# Patient Record
Sex: Female | Born: 1985
Health system: Southern US, Community
[De-identification: ages and names within clinical notes are randomized; demographics above are authoritative.]

## PROBLEM LIST (undated history)

## (undated) DIAGNOSIS — E282 Polycystic ovarian syndrome: Secondary | ICD-10-CM

## (undated) DIAGNOSIS — O99119 Other diseases of the blood and blood-forming organs and certain disorders involving the immune mechanism complicating pregnancy, unspecified trimester: Secondary | ICD-10-CM

## (undated) DIAGNOSIS — D696 Thrombocytopenia, unspecified: Secondary | ICD-10-CM

## (undated) DIAGNOSIS — O0990 Supervision of high risk pregnancy, unspecified, unspecified trimester: Secondary | ICD-10-CM

## (undated) HISTORY — DX: Supervision of high risk pregnancy, unspecified, unspecified trimester: O09.90

## (undated) HISTORY — DX: Polycystic ovarian syndrome: E28.2

---

## 2008-05-07 DIAGNOSIS — D696 Thrombocytopenia, unspecified: Secondary | ICD-10-CM

## 2008-05-07 HISTORY — DX: Thrombocytopenia, unspecified: D69.6

## 2009-04-29 ENCOUNTER — Inpatient Hospital Stay (HOSPITAL_COMMUNITY): Admission: AD | Admit: 2009-04-29 | Discharge: 2009-04-29 | Payer: Self-pay | Admitting: Obstetrics and Gynecology

## 2009-04-30 ENCOUNTER — Inpatient Hospital Stay (HOSPITAL_COMMUNITY): Admission: AD | Admit: 2009-04-30 | Discharge: 2009-05-02 | Payer: Self-pay | Admitting: Pediatrics

## 2010-08-07 LAB — CBC
HCT: 36.7 % (ref 36.0–46.0)
HCT: 37.2 % (ref 36.0–46.0)
Hemoglobin: 11.8 g/dL — ABNORMAL LOW (ref 12.0–15.0)
Hemoglobin: 11.9 g/dL — ABNORMAL LOW (ref 12.0–15.0)
MCHC: 32.3 g/dL (ref 30.0–36.0)
MCHC: 32.9 g/dL (ref 30.0–36.0)
MCHC: 33.2 g/dL (ref 30.0–36.0)
MCHC: 33.4 g/dL (ref 30.0–36.0)
MCV: 82 fL (ref 78.0–100.0)
Platelets: 85 10*3/uL — ABNORMAL LOW (ref 150–400)
Platelets: 94 10*3/uL — ABNORMAL LOW (ref 150–400)
RBC: 4.36 MIL/uL (ref 3.87–5.11)
RBC: 4.53 MIL/uL (ref 3.87–5.11)
RDW: 14.4 % (ref 11.5–15.5)
RDW: 14.4 % (ref 11.5–15.5)
WBC: 10.5 10*3/uL (ref 4.0–10.5)
WBC: 18.8 10*3/uL — ABNORMAL HIGH (ref 4.0–10.5)

## 2010-08-07 LAB — RPR: RPR Ser Ql: NONREACTIVE

## 2011-03-09 ENCOUNTER — Emergency Department (HOSPITAL_BASED_OUTPATIENT_CLINIC_OR_DEPARTMENT_OTHER)
Admission: EM | Admit: 2011-03-09 | Discharge: 2011-03-09 | Disposition: A | Discharge status: Home / Self Care | Payer: Managed Care, Other (non HMO) | Attending: Emergency Medicine | Admitting: Emergency Medicine

## 2011-03-09 ENCOUNTER — Encounter: Payer: Self-pay | Admitting: *Deleted

## 2011-03-09 ENCOUNTER — Emergency Department (INDEPENDENT_AMBULATORY_CARE_PROVIDER_SITE_OTHER): Payer: Managed Care, Other (non HMO)

## 2011-03-09 DIAGNOSIS — M542 Cervicalgia: Secondary | ICD-10-CM | POA: Insufficient documentation

## 2011-03-09 DIAGNOSIS — M436 Torticollis: Secondary | ICD-10-CM | POA: Insufficient documentation

## 2011-03-09 MED ORDER — HYDROCODONE-ACETAMINOPHEN 5-325 MG PO TABS: 2.0000 | ORAL_TABLET | ORAL | Status: AC | PRN

## 2011-03-09 MED ORDER — DIAZEPAM 5 MG PO TABS
5.0000 mg | ORAL_TABLET | Freq: Once | ORAL | Status: AC
  Administered 2011-03-09: 5 mg via ORAL
  Filled 2011-03-09: qty 1

## 2011-03-09 MED ORDER — IBUPROFEN 800 MG PO TABS
800.0000 mg | ORAL_TABLET | Freq: Once | ORAL | Status: AC
  Administered 2011-03-09: 800 mg via ORAL
  Filled 2011-03-09: qty 1

## 2011-03-09 MED ORDER — IBUPROFEN 800 MG PO TABS: 800.0000 mg | ORAL_TABLET | Freq: Three times a day (TID) | ORAL | Status: AC

## 2011-03-09 MED ORDER — CYCLOBENZAPRINE HCL 10 MG PO TABS: 10.0000 mg | ORAL_TABLET | Freq: Two times a day (BID) | ORAL | Status: AC | PRN

## 2011-03-09 MED ORDER — OXYCODONE-ACETAMINOPHEN 5-325 MG PO TABS
1.0000 | ORAL_TABLET | Freq: Once | ORAL | Status: AC
  Administered 2011-03-09: 1 via ORAL
  Filled 2011-03-09: qty 1

## 2011-03-09 NOTE — ED Provider Notes (Signed)
Medical screening examination/treatment/procedure(s) were performed by non-physician practitioner and as supervising physician I was immediately available for consultation/collaboration.   Joya Gaskins, MD 03/09/11 1534

## 2011-03-09 NOTE — ED Provider Notes (Signed)
History     CSN: [id number] Arrival date & time: 03/09/2011 12:05 PM   First MD Initiated Contact with Patient 03/09/11 1211      Chief Complaint  Patient presents with  . Neck Pain    (Consider location/radiation/quality/duration/timing/severity/associated sxs/prior treatment) Patient is a 25 y.o. female presenting with neck pain. The history is provided by the patient. The history is limited by a language barrier. No language interpreter was used.  Neck Pain  This is a new problem. The current episode started more than 2 days ago. The problem occurs constantly. The problem has not changed since onset.The pain is associated with nothing. There has been no fever. The pain is present in the generalized neck. The pain is at a severity of 6/10. The pain is moderate. The pain is worse during the day. Stiffness is present in the morning. She has tried heat for the symptoms. The treatment provided no relief.    History reviewed. No pertinent past medical history.  History reviewed. No pertinent past surgical history.  No family history on file.  History  Substance Use Topics  . Smoking status: Never Smoker   . Smokeless tobacco: Not on file  . Alcohol Use: Yes    OB History    Grav Para Term Preterm Abortions TAB SAB Ect Mult Living                  Review of Systems  HENT: Positive for neck pain.   All other systems reviewed and are negative.    Allergies  Review of patient's allergies indicates no known allergies.  Home Medications  No current outpatient prescriptions on file.  BP 126/74  Pulse 76  Temp(Src) 98.2 F (36.8 C) (Oral)  Resp 20  SpO2 100%  Physical Exam  Nursing note and vitals reviewed. Constitutional: She appears well-developed and well-nourished.  HENT:  Head: Normocephalic and atraumatic.  Mouth/Throat: Oropharynx is clear and moist.  Eyes: Pupils are equal, round, and reactive to light.  Neck: Neck supple.       Decreased range of motion,   nv and ns intact  Cardiovascular: Normal rate.   Pulmonary/Chest: Effort normal.  Musculoskeletal: Normal range of motion.  Skin: Skin is warm.  Psychiatric: She has a normal mood and affect.    ED Course  Procedures (including critical care time)  Labs Reviewed - No data to display Dg Cervical Spine Complete  03/09/2011  *RADIOLOGY REPORT*  Clinical Data: Neck pain radiating into the right shoulder.  No injury.  CERVICAL SPINE - COMPLETE 4+ VIEW  Comparison: None.  Findings: No prevertebral soft tissue swelling is identified.  No cervical vertebral malalignment noted.  No cervical spine fracture is evident.  IMPRESSION:  No significant abnormality identified.  If symptoms persist despite conservative therapy, MRI followup may be warranted.  Original Report Authenticated By: Dellia Cloud, M.D.     No diagnosis found.    MDM  c spine no fx.   Pt given percocet, valium and ibuprofen.   Pt has increased motion,  Pt still has pain.       Langston Masker, [place] 03/09/11 1334  Langston Masker, [place] 03/09/11 1336

## 2011-03-09 NOTE — ED Notes (Signed)
Patient states that she started experiencing neck and R shoulder pain Wednesday, took 800 mg ibuprofen at that time but no relief, continues to have shooting pain througt shoulder and hurts to turn head

## 2012-05-07 NOTE — L&D Delivery Note (Signed)
Delivery Note At 6:26 PM a viable and healthy female was delivered via Vaginal, Spontaneous Delivery (Presentation: ; Occiput Anterior).  APGAR: 8, 9; weight . pending  Placenta status: Intact, Spontaneous Pathology.  Cord: 3 vessels with the following complications: None.  Cord pH: none   Anesthesia: Epidural  Episiotomy: None Lacerations: None Suture Repair: n/a Est. Blood Loss (mL): 350  Mom to postpartum.  Baby to Couplet care / Skin to Skin.  Hisham Provence A 03/30/2013, 7:06 PM

## 2012-07-24 IMAGING — CR DG CERVICAL SPINE COMPLETE 4+V
6 series · 6 of 6 positions shown · non-contrast
Comparison: None.

CLINICAL DATA: Neck pain radiating into the right shoulder.  No
injury.

CERVICAL SPINE - COMPLETE 4+ VIEW

[w c-spine oblique (1 of 2)]
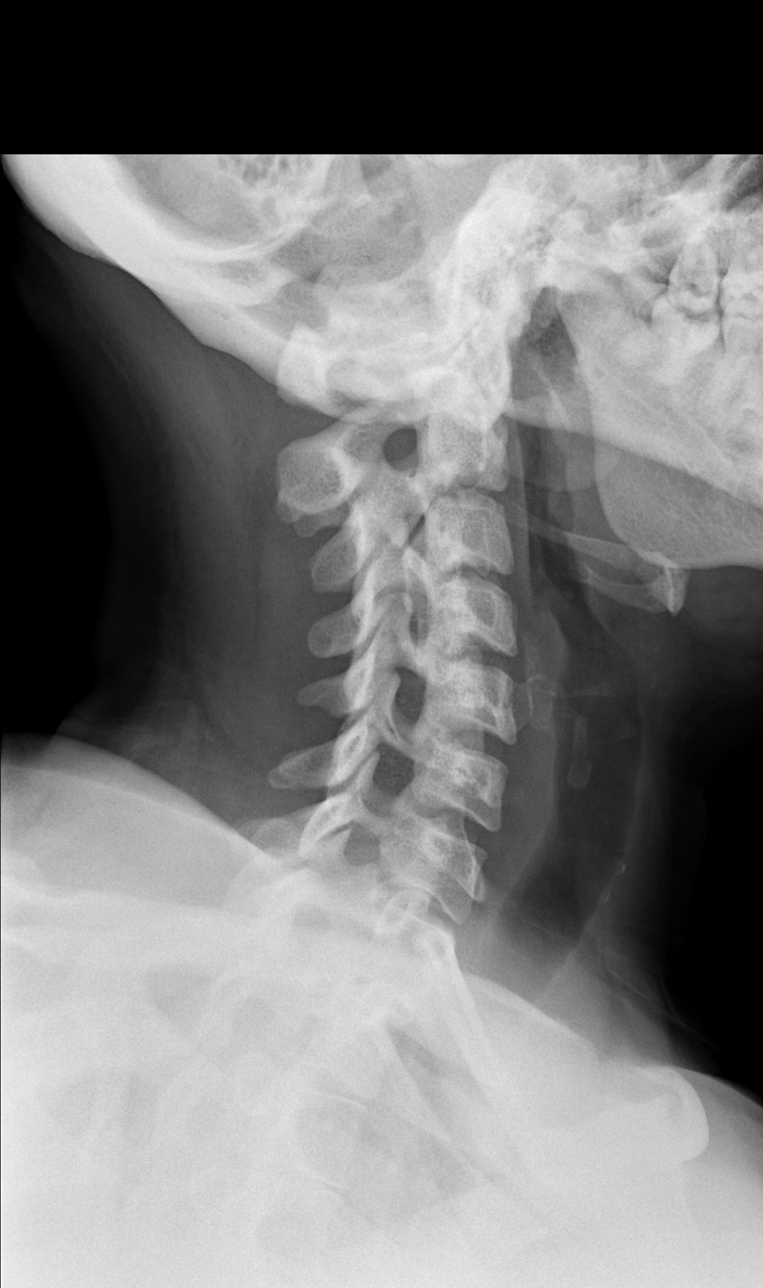

[w c-spine lat]
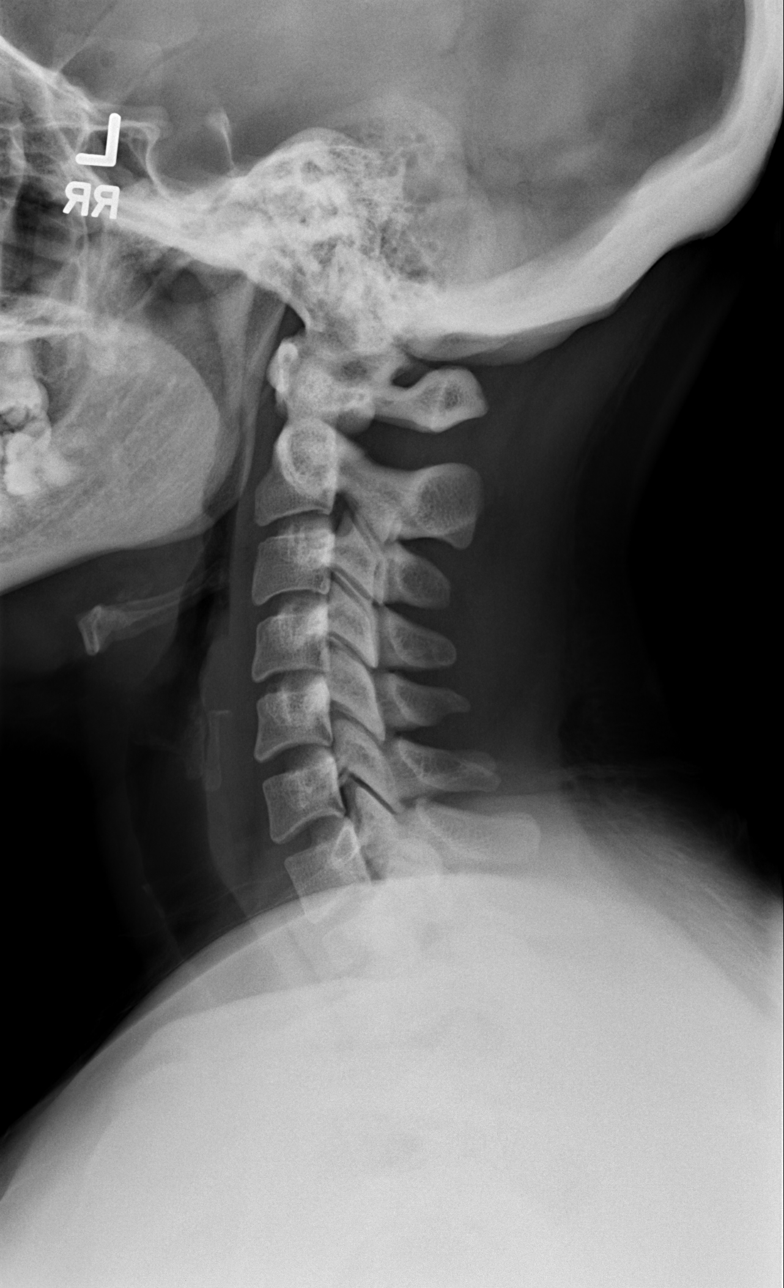

[w c-spine oblique (2 of 2)]
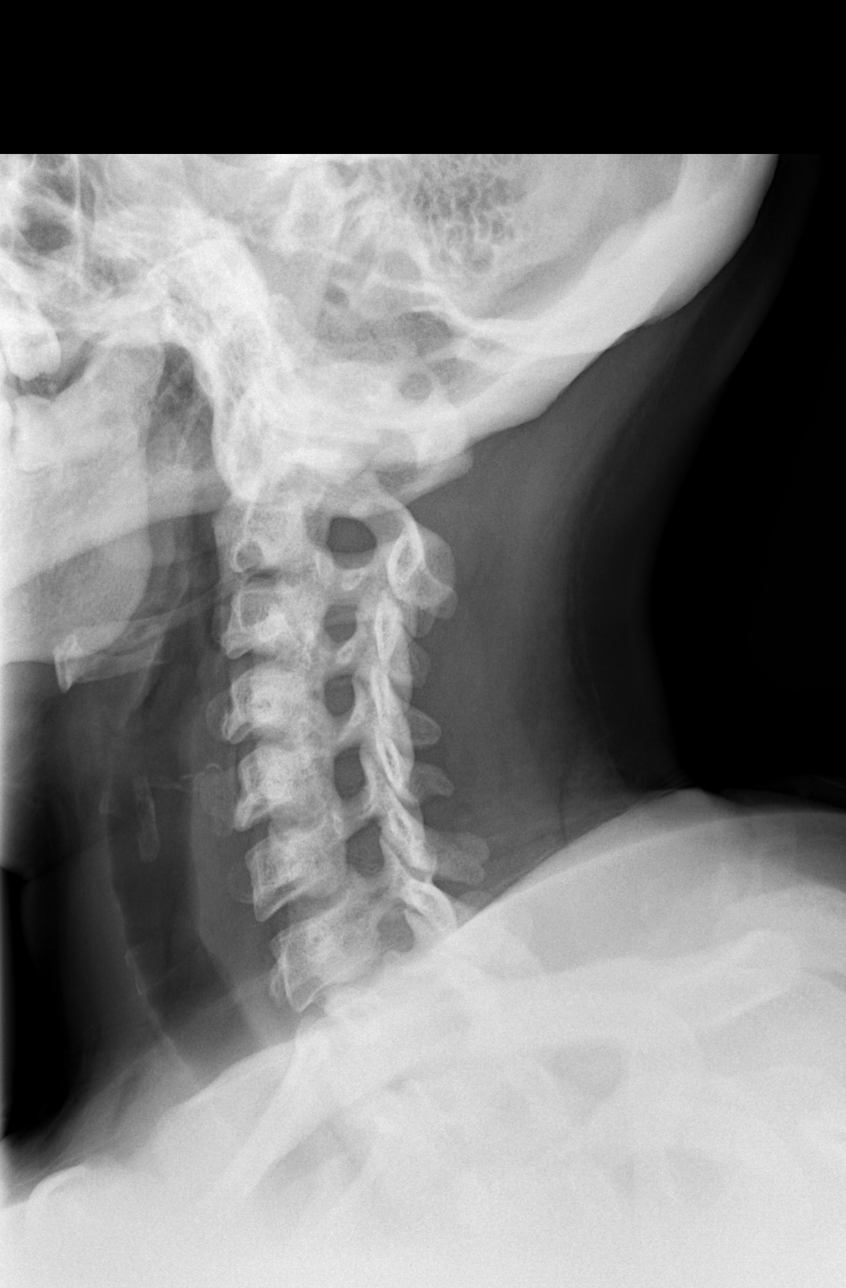

[w c-spine a.p.]
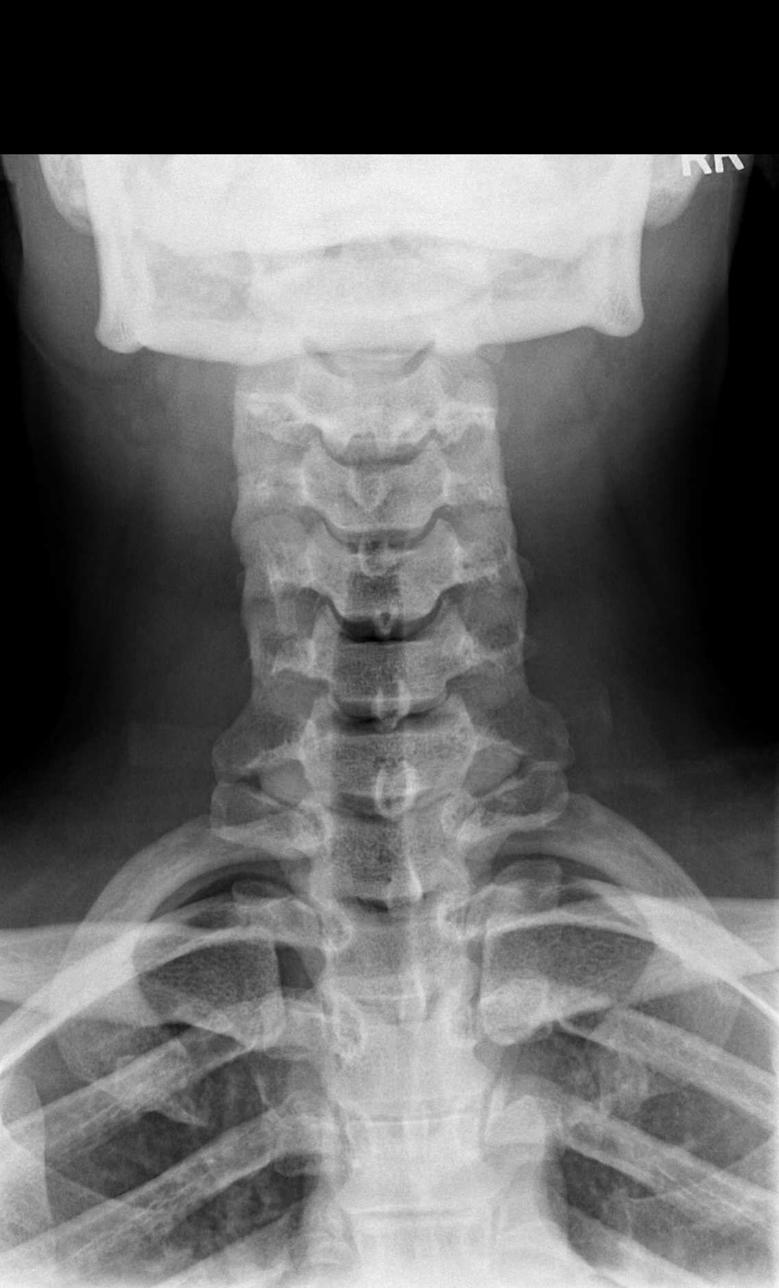

[w c-spine odontoid (1 of 2)]
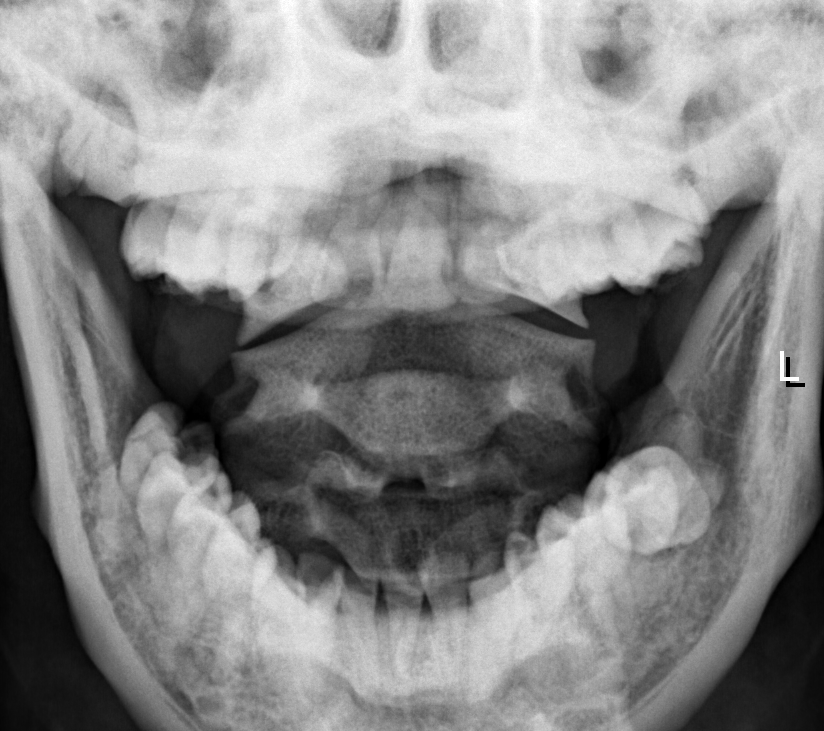

[w c-spine odontoid (2 of 2)]
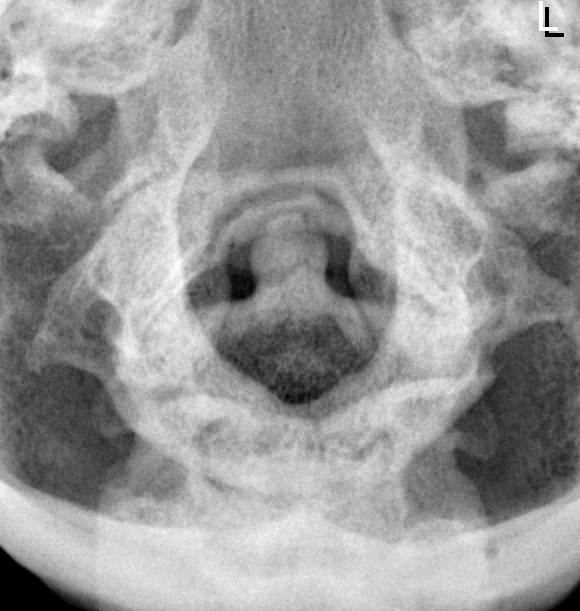

[6 of 6 positions shown; findings below may reference images not displayed]

FINDINGS: No prevertebral soft tissue swelling is identified.  No
cervical vertebral malalignment noted.  No cervical spine fracture
is evident.
IMPRESSION: No significant abnormality identified.  If symptoms persist
despite conservative therapy, MRI followup may be warranted.

## 2012-09-12 LAB — OB RESULTS CONSOLE ANTIBODY SCREEN: Antibody Screen: NEGATIVE

## 2012-09-12 LAB — OB RESULTS CONSOLE HEPATITIS B SURFACE ANTIGEN: Hepatitis B Surface Ag: NEGATIVE

## 2012-09-12 LAB — OB RESULTS CONSOLE ABO/RH

## 2012-09-12 LAB — OB RESULTS CONSOLE RUBELLA ANTIBODY, IGM: Rubella: IMMUNE

## 2012-09-20 LAB — OB RESULTS CONSOLE GC/CHLAMYDIA
Chlamydia: NEGATIVE
Gonorrhea: NEGATIVE

## 2013-01-14 ENCOUNTER — Encounter: Payer: Self-pay | Admitting: Oncology

## 2013-01-14 ENCOUNTER — Other Ambulatory Visit: Payer: Self-pay | Admitting: Oncology

## 2013-01-14 DIAGNOSIS — O0990 Supervision of high risk pregnancy, unspecified, unspecified trimester: Secondary | ICD-10-CM

## 2013-01-14 DIAGNOSIS — D696 Thrombocytopenia, unspecified: Secondary | ICD-10-CM | POA: Insufficient documentation

## 2013-01-14 HISTORY — DX: Supervision of high risk pregnancy, unspecified, unspecified trimester: O09.90

## 2013-01-20 ENCOUNTER — Telehealth: Payer: Self-pay | Admitting: Oncology

## 2013-01-20 NOTE — Telephone Encounter (Signed)
C/D 01/20/13 for appt. 01/26/13

## 2013-01-21 ENCOUNTER — Other Ambulatory Visit: Payer: Self-pay | Admitting: Oncology

## 2013-01-26 ENCOUNTER — Encounter: Payer: Self-pay | Admitting: Oncology

## 2013-01-26 ENCOUNTER — Ambulatory Visit (HOSPITAL_BASED_OUTPATIENT_CLINIC_OR_DEPARTMENT_OTHER): Payer: Managed Care, Other (non HMO) | Admitting: Oncology

## 2013-01-26 ENCOUNTER — Ambulatory Visit (HOSPITAL_BASED_OUTPATIENT_CLINIC_OR_DEPARTMENT_OTHER): Payer: Managed Care, Other (non HMO) | Admitting: Lab

## 2013-01-26 ENCOUNTER — Ambulatory Visit: Payer: Managed Care, Other (non HMO)

## 2013-01-26 ENCOUNTER — Telehealth: Payer: Self-pay | Admitting: Oncology

## 2013-01-26 ENCOUNTER — Other Ambulatory Visit: Payer: Self-pay | Admitting: *Deleted

## 2013-01-26 VITALS — BP 115/67 | HR 85 | Temp 98.3°F | Resp 18 | Ht 63.0 in | Wt 229.6 lb

## 2013-01-26 DIAGNOSIS — O0990 Supervision of high risk pregnancy, unspecified, unspecified trimester: Secondary | ICD-10-CM

## 2013-01-26 DIAGNOSIS — D696 Thrombocytopenia, unspecified: Secondary | ICD-10-CM

## 2013-01-26 DIAGNOSIS — O0993 Supervision of high risk pregnancy, unspecified, third trimester: Secondary | ICD-10-CM

## 2013-01-26 LAB — CBC & DIFF AND RETIC
EOS%: 0.6 % (ref 0.0–7.0)
Eosinophils Absolute: 0.1 10*3/uL (ref 0.0–0.5)
Immature Retic Fract: 12.5 % — ABNORMAL HIGH (ref 1.60–10.00)
MCV: 76.6 fL — ABNORMAL LOW (ref 79.5–101.0)
MONO%: 9.7 % (ref 0.0–14.0)
NEUT#: 5.9 10*3/uL (ref 1.5–6.5)
RBC: 4.45 10*6/uL (ref 3.70–5.45)
RDW: 13.9 % (ref 11.2–14.5)
Retic %: 1.73 % (ref 0.70–2.10)
Retic Ct Abs: 76.99 10*3/uL (ref 33.70–90.70)
lymph#: 1.2 10*3/uL (ref 0.9–3.3)

## 2013-01-26 LAB — COMPREHENSIVE METABOLIC PANEL (CC13)
Albumin: 2.9 g/dL — ABNORMAL LOW (ref 3.5–5.0)
Alkaline Phosphatase: 48 U/L (ref 40–150)
BUN: 5 mg/dL — ABNORMAL LOW (ref 7.0–26.0)
CO2: 22 mEq/L (ref 22–29)
Calcium: 8.9 mg/dL (ref 8.4–10.4)
Chloride: 109 mEq/L (ref 98–109)
Glucose: 82 mg/dl (ref 70–140)
Potassium: 4.2 mEq/L (ref 3.5–5.1)
Sodium: 138 mEq/L (ref 136–145)
Total Protein: 6.8 g/dL (ref 6.4–8.3)

## 2013-01-26 LAB — MORPHOLOGY

## 2013-01-26 LAB — CHCC SMEAR

## 2013-01-26 LAB — LACTATE DEHYDROGENASE (CC13): LDH: 149 U/L (ref 125–245)

## 2013-01-26 NOTE — Progress Notes (Signed)
New Patient Hematology-Oncology Evaluation   Melissa Li [id number] Jun 24, 1985 27 y.o. 01/26/2013  CC: Dr. Maxie Better   Reason for referral: Decreased platelet count in a woman in her third trimester of pregnancy    HPI:  27 year old woman in excellent health with no medical or surgical illness. She was first pregnant back in 2010. At time of delivery platelet count fell to a low of 85,000 transiently. A repeat count the same day was 95,000 and she had an uncomplicated epidural anesthetic. She had a vaginal delivery.  We have no followup blood counts in the system until recent prenatal visit to her gynecologist on 01/13/2013. CBC was done. Hemoglobin 12.1, white count 8100, platelet count 97,000. She is now pregnant again and at 30 weeks with a due date of 04/04/2013. She remains asymptomatic. No clinical bleeding. She is on no medication except for her prenatal vitamins.  She has sickle cell trait. No family history of any blood disorders.    PMH: No hypertension, diabetes, asthma, kidney stones, no history of seizure or blood clots. Past Medical History  Diagnosis Date  . Pregnancy, supervision of, high-risk 01/14/2013    No major surgery  Allergies: No Known Allergies  Medications: Prenatal vitamins. As needed Tylenol.  Social History:  She works at a [employer]. She is a healthy 32-year-old daughter. She is an only child.   Family History: I took care of her father who died of complications of metastatic colon cancer 3 years ago. Mother is alive and well. and accompanies her today   Review of Systems: Constitutional symptoms: No constitutional symptoms HEENT: No headache or change in vision Respiratory: No cough or dyspnea Cardiovascular:  No chest pain or palpitations Gastrointestinal ROS: No change in bowel habit Genito-Urinary ROS: No vaginal bleeding or spotting Hematological and Lymphatic: Musculoskeletal: No muscle bone or joint  pain Neurologic: No headache or change in vision Dermatologic: No rash Remaining ROS negative.  Physical Exam: Blood pressure 115/67, pulse 85, temperature 98.3 F (36.8 C), resp. rate 18, height 5\' 3"  (1.6 m), weight 229 lb 9.6 oz (104.146 kg). Wt Readings from Last 3 Encounters:  01/26/13 229 lb 9.6 oz (104.146 kg)    General appearance: Well-nourished African American woman HENNT: Pharynx no erythema exudate ulcer or mass, no thyromegaly Lymph nodes: No cervical, supraclavicular, or axillary adenopathy Breasts: Lungs: Clear to auscultation resonant to percussion Heart: Regular rhythm no murmur gallop or rub Vascular: No cyanosis Abdominal: Third trimester pregnancy GU: Extremities: No edema, no calf tenderness Neurologic: Mental status intact, cranial nerves grossly normal, PERRLA, optic disc sharp and vessels normal, no hemorrhage or exudate, motor strength 5 over 5, reflexes 1+ symmetric Skin: Chronic acne on the face no other rash or ecchymosis    Lab Results: Lab Results  Component Value Date   WBC 18.8* 05/01/2009   HGB 11.8* 05/01/2009   HCT 35.8* 05/01/2009   MCV 82.2 05/01/2009   PLT 95* 05/01/2009     Chemistry   No results found for this basename: NA, K, CL, CO2, BUN, CREATININE, GLU   No results found for this basename: CALCIUM, ALKPHOS, AST, ALT, BILITOT    CBC today 01/26/2013: Hemoglobin 11.5, hematocrit 34.1, MCV 76.6, white count 8000, 75% neutrophils, 15% lymphocytes, platelet count 105,000.   Review of peripheral blood film: Normochromic, normocytic, red cells. Mature white cells. 5-14 platelets per high power field many are large.      Impression and Plan: Likely benign thrombocytopenia of pregnancy  Actual platelet  count likely higher than the machine count as many of the platelets are quite large and will be counted as red cells.  Recommendation: I don't think she needs any medical intervention at this time. I will monitor blood counts  in this office on a weekly basis until time of delivery. As long as her platelet count stays 80,000 or above she is clear for epidural anesthesia.      Levert Feinstein, MD 01/26/2013, 11:11 AM

## 2013-01-26 NOTE — Telephone Encounter (Signed)
, °

## 2013-01-26 NOTE — Progress Notes (Signed)
Checked in new patient with no financial issues. Mail and phone for communication. °

## 2013-02-02 ENCOUNTER — Other Ambulatory Visit (HOSPITAL_BASED_OUTPATIENT_CLINIC_OR_DEPARTMENT_OTHER): Payer: Managed Care, Other (non HMO) | Admitting: Lab

## 2013-02-02 DIAGNOSIS — O0993 Supervision of high risk pregnancy, unspecified, third trimester: Secondary | ICD-10-CM

## 2013-02-02 DIAGNOSIS — D696 Thrombocytopenia, unspecified: Secondary | ICD-10-CM

## 2013-02-02 LAB — CBC WITH DIFFERENTIAL/PLATELET
Basophils Absolute: 0 10*3/uL (ref 0.0–0.1)
Eosinophils Absolute: 0.1 10*3/uL (ref 0.0–0.5)
HCT: 31.7 % — ABNORMAL LOW (ref 34.8–46.6)
HGB: 10.8 g/dL — ABNORMAL LOW (ref 11.6–15.9)
NEUT#: 6.2 10*3/uL (ref 1.5–6.5)
RDW: 13.8 % (ref 11.2–14.5)
lymph#: 1.6 10*3/uL (ref 0.9–3.3)

## 2013-02-09 ENCOUNTER — Other Ambulatory Visit (HOSPITAL_BASED_OUTPATIENT_CLINIC_OR_DEPARTMENT_OTHER): Payer: Managed Care, Other (non HMO) | Admitting: Lab

## 2013-02-09 DIAGNOSIS — O0993 Supervision of high risk pregnancy, unspecified, third trimester: Secondary | ICD-10-CM

## 2013-02-09 DIAGNOSIS — D696 Thrombocytopenia, unspecified: Secondary | ICD-10-CM

## 2013-02-09 DIAGNOSIS — O99891 Other specified diseases and conditions complicating pregnancy: Secondary | ICD-10-CM

## 2013-02-09 LAB — CBC WITH DIFFERENTIAL/PLATELET
Basophils Absolute: 0 10*3/uL (ref 0.0–0.1)
Eosinophils Absolute: 0.1 10*3/uL (ref 0.0–0.5)
HCT: 35.6 % (ref 34.8–46.6)
HGB: 12.2 g/dL (ref 11.6–15.9)
LYMPH%: 14.4 % (ref 14.0–49.7)
MONO#: 0.8 10*3/uL (ref 0.1–0.9)
NEUT#: 7.8 10*3/uL — ABNORMAL HIGH (ref 1.5–6.5)
NEUT%: 76.8 % (ref 38.4–76.8)
Platelets: 101 10*3/uL — ABNORMAL LOW (ref 145–400)
WBC: 10.2 10*3/uL (ref 3.9–10.3)
nRBC: 0 % (ref 0–0)

## 2013-02-16 ENCOUNTER — Other Ambulatory Visit (HOSPITAL_BASED_OUTPATIENT_CLINIC_OR_DEPARTMENT_OTHER): Payer: Managed Care, Other (non HMO) | Admitting: Lab

## 2013-02-16 DIAGNOSIS — O0993 Supervision of high risk pregnancy, unspecified, third trimester: Secondary | ICD-10-CM

## 2013-02-16 DIAGNOSIS — O99891 Other specified diseases and conditions complicating pregnancy: Secondary | ICD-10-CM

## 2013-02-16 DIAGNOSIS — D696 Thrombocytopenia, unspecified: Secondary | ICD-10-CM

## 2013-02-16 LAB — CBC WITH DIFFERENTIAL/PLATELET
Basophils Absolute: 0 10*3/uL (ref 0.0–0.1)
EOS%: 0.9 % (ref 0.0–7.0)
HCT: 34.1 % — ABNORMAL LOW (ref 34.8–46.6)
HGB: 11.6 g/dL (ref 11.6–15.9)
MCH: 26.1 pg (ref 25.1–34.0)
NEUT%: 72.9 % (ref 38.4–76.8)
lymph#: 1.7 10*3/uL (ref 0.9–3.3)

## 2013-02-17 ENCOUNTER — Telehealth: Payer: Self-pay | Admitting: *Deleted

## 2013-02-17 NOTE — Telephone Encounter (Signed)
Per Dr. Cyndie Chime; spoke with pt informing her platelet count stable @99 .  Pt verbalized understanding and confirmed appt for labs on Mondays and Office visit 03/16/13.

## 2013-02-23 ENCOUNTER — Other Ambulatory Visit (HOSPITAL_BASED_OUTPATIENT_CLINIC_OR_DEPARTMENT_OTHER): Payer: Managed Care, Other (non HMO) | Admitting: Lab

## 2013-02-23 DIAGNOSIS — D696 Thrombocytopenia, unspecified: Secondary | ICD-10-CM

## 2013-02-23 DIAGNOSIS — O0993 Supervision of high risk pregnancy, unspecified, third trimester: Secondary | ICD-10-CM

## 2013-02-23 DIAGNOSIS — O99891 Other specified diseases and conditions complicating pregnancy: Secondary | ICD-10-CM

## 2013-02-23 LAB — CBC WITH DIFFERENTIAL/PLATELET
Basophils Absolute: 0 10*3/uL (ref 0.0–0.1)
Eosinophils Absolute: 0.1 10*3/uL (ref 0.0–0.5)
HGB: 11.9 g/dL (ref 11.6–15.9)
LYMPH%: 15.7 % (ref 14.0–49.7)
MCV: 75.9 fL — ABNORMAL LOW (ref 79.5–101.0)
MONO%: 8.8 % (ref 0.0–14.0)
NEUT#: 6.2 10*3/uL (ref 1.5–6.5)
NEUT%: 74.6 % (ref 38.4–76.8)
Platelets: 90 10*3/uL — ABNORMAL LOW (ref 145–400)
RBC: 4.57 10*6/uL (ref 3.70–5.45)

## 2013-02-27 ENCOUNTER — Telehealth: Payer: Self-pay | Admitting: *Deleted

## 2013-02-27 NOTE — Telephone Encounter (Signed)
Message copied by Caren Griffins on Fri Feb 27, 2013 11:53 AM ------      Message from: Orbie Hurst      Created: Fri Feb 27, 2013 11:49 AM                   ----- Message -----         From: Levert Feinstein, MD         Sent: 02/23/2013   8:30 PM           To: Orbie Hurst, RN            Call pt  Platelet count 90,000  Within range of previous values ------

## 2013-02-27 NOTE — Telephone Encounter (Signed)
Called and left msg with information on vm.  SLJ 

## 2013-03-02 ENCOUNTER — Other Ambulatory Visit (HOSPITAL_BASED_OUTPATIENT_CLINIC_OR_DEPARTMENT_OTHER): Payer: Managed Care, Other (non HMO) | Admitting: Lab

## 2013-03-02 DIAGNOSIS — O0993 Supervision of high risk pregnancy, unspecified, third trimester: Secondary | ICD-10-CM

## 2013-03-02 DIAGNOSIS — D696 Thrombocytopenia, unspecified: Secondary | ICD-10-CM

## 2013-03-02 LAB — CBC WITH DIFFERENTIAL/PLATELET
BASO%: 0.1 % (ref 0.0–2.0)
EOS%: 0.8 % (ref 0.0–7.0)
HCT: 34.5 % — ABNORMAL LOW (ref 34.8–46.6)
LYMPH%: 14.6 % (ref 14.0–49.7)
MCH: 25.8 pg (ref 25.1–34.0)
MCHC: 33.9 g/dL (ref 31.5–36.0)
MCV: 76.2 fL — ABNORMAL LOW (ref 79.5–101.0)
NEUT%: 75.9 % (ref 38.4–76.8)
Platelets: 108 10*3/uL — ABNORMAL LOW (ref 145–400)

## 2013-03-03 LAB — OB RESULTS CONSOLE GBS: GBS: POSITIVE

## 2013-03-09 ENCOUNTER — Other Ambulatory Visit (HOSPITAL_BASED_OUTPATIENT_CLINIC_OR_DEPARTMENT_OTHER): Payer: Managed Care, Other (non HMO) | Admitting: Lab

## 2013-03-09 DIAGNOSIS — D696 Thrombocytopenia, unspecified: Secondary | ICD-10-CM

## 2013-03-09 DIAGNOSIS — O0993 Supervision of high risk pregnancy, unspecified, third trimester: Secondary | ICD-10-CM

## 2013-03-09 LAB — CBC WITH DIFFERENTIAL/PLATELET
Basophils Absolute: 0 10*3/uL (ref 0.0–0.1)
EOS%: 0.5 % (ref 0.0–7.0)
LYMPH%: 15.8 % (ref 14.0–49.7)
MCH: 25.7 pg (ref 25.1–34.0)
MCV: 75.6 fL — ABNORMAL LOW (ref 79.5–101.0)
MONO%: 7.6 % (ref 0.0–14.0)
Platelets: 98 10*3/uL — ABNORMAL LOW (ref 145–400)
RBC: 4.47 10*6/uL (ref 3.70–5.45)
RDW: 14.1 % (ref 11.2–14.5)

## 2013-03-12 ENCOUNTER — Other Ambulatory Visit: Payer: Self-pay

## 2013-03-16 ENCOUNTER — Telehealth: Payer: Self-pay | Admitting: Oncology

## 2013-03-16 ENCOUNTER — Ambulatory Visit (HOSPITAL_BASED_OUTPATIENT_CLINIC_OR_DEPARTMENT_OTHER): Payer: Managed Care, Other (non HMO) | Admitting: Nurse Practitioner

## 2013-03-16 ENCOUNTER — Other Ambulatory Visit (HOSPITAL_BASED_OUTPATIENT_CLINIC_OR_DEPARTMENT_OTHER): Payer: Managed Care, Other (non HMO) | Admitting: Lab

## 2013-03-16 VITALS — BP 113/65 | HR 94 | Temp 98.2°F | Resp 18 | Ht 63.0 in | Wt 231.0 lb

## 2013-03-16 DIAGNOSIS — O0993 Supervision of high risk pregnancy, unspecified, third trimester: Secondary | ICD-10-CM

## 2013-03-16 DIAGNOSIS — D696 Thrombocytopenia, unspecified: Secondary | ICD-10-CM

## 2013-03-16 LAB — CBC WITH DIFFERENTIAL/PLATELET
BASO%: 0.1 % (ref 0.0–2.0)
EOS%: 0.7 % (ref 0.0–7.0)
LYMPH%: 18.3 % (ref 14.0–49.7)
MCH: 25.8 pg (ref 25.1–34.0)
MCHC: 34.2 g/dL (ref 31.5–36.0)
MONO#: 0.7 10*3/uL (ref 0.1–0.9)
MONO%: 9 % (ref 0.0–14.0)
NEUT%: 71.9 % (ref 38.4–76.8)
Platelets: 93 10*3/uL — ABNORMAL LOW (ref 145–400)
RBC: 4.57 10*6/uL (ref 3.70–5.45)
WBC: 8.1 10*3/uL (ref 3.9–10.3)
nRBC: 0 % (ref 0–0)

## 2013-03-16 NOTE — Progress Notes (Signed)
OFFICE PROGRESS NOTE  Interval history:  [name] is a 27 year old woman currently in her third trimester of pregnancy with likely benign thrombocytopenia of pregnancy.  She was initially seen by Dr. Cyndie Chime on 01/26/2013 at which time she was at approximately 30 weeks. Platelet count at that time was 105,000.  CBCs are being checked on a weekly basis with platelet count values as follows: 02/02/2013 101,000; 02/09/2013 101,000; 02/16/2013 99,000; 02/23/2013 90,000; 03/02/2013 108,000; 03/09/2013 98,000.  She is seen today for scheduled followup. She denies any bruising or bleeding. No unusual pains. She has occasional mild dyspnea on exertion. She has intermittent bilateral ankle edema. No calf pain.  Objective: Blood pressure 113/65, pulse 94, temperature 98.2 F (36.8 C), temperature source Oral, resp. rate 18, height 5\' 3"  (1.6 m), weight 231 lb (104.781 kg), SpO2 100.00%.  Oropharynx is without thrush or ulceration. Lungs are clear. No wheezes or rales. Regular cardiac rhythm. Abdomen consistent with pregnancy. No leg edema. Calves nontender. Alert and oriented. No skin rash. No ecchymoses.  Lab Results: Lab Results  Component Value Date   WBC 8.1 03/16/2013   HGB 11.8 03/16/2013   HCT 34.5* 03/16/2013   MCV 75.5* 03/16/2013   PLT 93 Large & giant platelets* 03/16/2013    Chemistry:    Chemistry      Component Value Date/Time   NA 138 01/26/2013 1045   K 4.2 01/26/2013 1045   CO2 22 01/26/2013 1045   BUN 5.0* 01/26/2013 1045   CREATININE 0.7 01/26/2013 1045      Component Value Date/Time   CALCIUM 8.9 01/26/2013 1045   ALKPHOS 48 01/26/2013 1045   AST 16 01/26/2013 1045   ALT 15 01/26/2013 1045   BILITOT 0.32 01/26/2013 1045       Studies/Results: No results found.  Medications: I have reviewed the patient's current medications.  Assessment/Plan:  1. Likely benign thrombocytopenia of pregnancy. 2. Approximately [redacted] weeks pregnant.  Disposition-the platelet  count overall is remaining stable. Per Dr. Patsy Lager last office note 01/26/2013 as long as her platelet count stays 80,000 or above she is clear for epidural anesthesia.  We will continue to check labs on a weekly basis and plan to see her back in approximately one month.  Plan reviewed with Dr. Cyndie Chime.  Lonna Cobb ANP/GNP-BC

## 2013-03-16 NOTE — Telephone Encounter (Signed)
gv pt appt schedule for November and December.  °

## 2013-03-23 ENCOUNTER — Other Ambulatory Visit (HOSPITAL_BASED_OUTPATIENT_CLINIC_OR_DEPARTMENT_OTHER): Payer: Managed Care, Other (non HMO) | Admitting: Lab

## 2013-03-23 DIAGNOSIS — D696 Thrombocytopenia, unspecified: Secondary | ICD-10-CM

## 2013-03-23 LAB — CBC WITH DIFFERENTIAL/PLATELET
BASO%: 0.1 % (ref 0.0–2.0)
EOS%: 0.6 % (ref 0.0–7.0)
HGB: 11.2 g/dL — ABNORMAL LOW (ref 11.6–15.9)
MCH: 25.6 pg (ref 25.1–34.0)
MCHC: 33.8 g/dL (ref 31.5–36.0)
MCV: 75.7 fL — ABNORMAL LOW (ref 79.5–101.0)
MONO%: 7.2 % (ref 0.0–14.0)
RBC: 4.37 10*6/uL (ref 3.70–5.45)
RDW: 14.5 % (ref 11.2–14.5)
lymph#: 1.3 10*3/uL (ref 0.9–3.3)
nRBC: 0 % (ref 0–0)

## 2013-03-30 ENCOUNTER — Encounter (HOSPITAL_COMMUNITY): Payer: Managed Care, Other (non HMO) | Admitting: Anesthesiology

## 2013-03-30 ENCOUNTER — Encounter (HOSPITAL_COMMUNITY): Payer: Self-pay

## 2013-03-30 ENCOUNTER — Inpatient Hospital Stay (HOSPITAL_COMMUNITY): Payer: Managed Care, Other (non HMO) | Admitting: Anesthesiology

## 2013-03-30 ENCOUNTER — Inpatient Hospital Stay (HOSPITAL_COMMUNITY)
Admission: AD | Admit: 2013-03-30 | Discharge: 2013-04-01 | DRG: 775 | Disposition: A | Discharge status: Home / Self Care | Payer: Managed Care, Other (non HMO) | Source: Ambulatory Visit | Attending: Obstetrics and Gynecology | Admitting: Obstetrics and Gynecology

## 2013-03-30 ENCOUNTER — Other Ambulatory Visit: Payer: Managed Care, Other (non HMO) | Admitting: Lab

## 2013-03-30 DIAGNOSIS — D689 Coagulation defect, unspecified: Principal | ICD-10-CM | POA: Diagnosis present

## 2013-03-30 DIAGNOSIS — D696 Thrombocytopenia, unspecified: Secondary | ICD-10-CM | POA: Diagnosis present

## 2013-03-30 DIAGNOSIS — Z22330 Carrier of Group B streptococcus: Secondary | ICD-10-CM

## 2013-03-30 DIAGNOSIS — O99892 Other specified diseases and conditions complicating childbirth: Secondary | ICD-10-CM | POA: Diagnosis present

## 2013-03-30 DIAGNOSIS — D62 Acute posthemorrhagic anemia: Secondary | ICD-10-CM | POA: Diagnosis not present

## 2013-03-30 DIAGNOSIS — O9903 Anemia complicating the puerperium: Secondary | ICD-10-CM | POA: Diagnosis not present

## 2013-03-30 DIAGNOSIS — O0990 Supervision of high risk pregnancy, unspecified, unspecified trimester: Secondary | ICD-10-CM

## 2013-03-30 HISTORY — DX: Thrombocytopenia, unspecified: D69.6

## 2013-03-30 HISTORY — DX: Other diseases of the blood and blood-forming organs and certain disorders involving the immune mechanism complicating pregnancy, unspecified trimester: O99.119

## 2013-03-30 LAB — CBC
HCT: 37.3 % (ref 36.0–46.0)
HCT: 33.1 % — ABNORMAL LOW (ref 36.0–46.0)
Hemoglobin: 13.1 g/dL (ref 12.0–15.0)
Hemoglobin: 11.4 g/dL — ABNORMAL LOW (ref 12.0–15.0)
MCH: 25.7 pg — ABNORMAL LOW (ref 26.0–34.0)
MCHC: 35.1 g/dL (ref 30.0–36.0)
MCHC: 34.4 g/dL (ref 30.0–36.0)
MCV: 74.6 fL — ABNORMAL LOW (ref 78.0–100.0)
Platelets: 92 10*3/uL — ABNORMAL LOW (ref 150–400)
Platelets: 92 10*3/uL — ABNORMAL LOW (ref 150–400)
RBC: 5 MIL/uL (ref 3.87–5.11)
RBC: 4.43 MIL/uL (ref 3.87–5.11)
RDW: 14.5 % (ref 11.5–15.5)
WBC: 11.6 10*3/uL — ABNORMAL HIGH (ref 4.0–10.5)

## 2013-03-30 LAB — PREPARE RBC (CROSSMATCH)

## 2013-03-30 LAB — TYPE AND SCREEN: Antibody Screen: NEGATIVE

## 2013-03-30 MED ORDER — LACTATED RINGERS IV SOLN: 500.0000 mL | INTRAVENOUS | Status: DC | PRN

## 2013-03-30 MED ORDER — LACTATED RINGERS IV SOLN
INTRAVENOUS | Status: DC
  Administered 2013-03-30 (×2): 125 mL/h via INTRAVENOUS

## 2013-03-30 MED ORDER — LIDOCAINE HCL (PF) 1 % IJ SOLN
30.0000 mL | INTRAMUSCULAR | Status: DC | PRN
  Filled 2013-03-30 (×2): qty 30

## 2013-03-30 MED ORDER — LACTATED RINGERS IV SOLN: 500.0000 mL | Freq: Once | INTRAVENOUS | Status: DC

## 2013-03-30 MED ORDER — WITCH HAZEL-GLYCERIN EX PADS: 1.0000 "application " | MEDICATED_PAD | CUTANEOUS | Status: DC | PRN

## 2013-03-30 MED ORDER — EPHEDRINE 5 MG/ML INJ
INTRAVENOUS | Status: AC
  Filled 2013-03-30: qty 4

## 2013-03-30 MED ORDER — FERROUS SULFATE 325 (65 FE) MG PO TABS
325.0000 mg | ORAL_TABLET | Freq: Two times a day (BID) | ORAL | Status: DC
  Administered 2013-03-31 – 2013-04-01 (×3): 325 mg via ORAL
  Filled 2013-03-30 (×3): qty 1

## 2013-03-30 MED ORDER — IBUPROFEN 600 MG PO TABS
600.0000 mg | ORAL_TABLET | Freq: Four times a day (QID) | ORAL | Status: DC
  Administered 2013-03-30 – 2013-03-31 (×2): 600 mg via ORAL
  Filled 2013-03-30 (×2): qty 1

## 2013-03-30 MED ORDER — OXYTOCIN BOLUS FROM INFUSION: 500.0000 mL | INTRAVENOUS | Status: DC

## 2013-03-30 MED ORDER — BENZOCAINE-MENTHOL 20-0.5 % EX AERO
1.0000 "application " | INHALATION_SPRAY | CUTANEOUS | Status: DC | PRN
  Administered 2013-04-01: 1 via TOPICAL
  Filled 2013-03-30: qty 56

## 2013-03-30 MED ORDER — ZOLPIDEM TARTRATE 5 MG PO TABS: 5.0000 mg | ORAL_TABLET | Freq: Every evening | ORAL | Status: DC | PRN

## 2013-03-30 MED ORDER — ONDANSETRON HCL 4 MG PO TABS: 4.0000 mg | ORAL_TABLET | ORAL | Status: DC | PRN

## 2013-03-30 MED ORDER — CITRIC ACID-SODIUM CITRATE 334-500 MG/5ML PO SOLN: 30.0000 mL | ORAL | Status: DC | PRN

## 2013-03-30 MED ORDER — EPHEDRINE 5 MG/ML INJ
10.0000 mg | INTRAVENOUS | Status: DC | PRN
  Administered 2013-03-30: 10 mg via INTRAVENOUS
  Filled 2013-03-30: qty 2

## 2013-03-30 MED ORDER — BUTORPHANOL TARTRATE 1 MG/ML IJ SOLN
1.0000 mg | Freq: Once | INTRAMUSCULAR | Status: AC
  Administered 2013-03-30: 1 mg via INTRAVENOUS

## 2013-03-30 MED ORDER — ACETAMINOPHEN 325 MG PO TABS: 650.0000 mg | ORAL_TABLET | ORAL | Status: DC | PRN

## 2013-03-30 MED ORDER — DIPHENHYDRAMINE HCL 25 MG PO CAPS: 25.0000 mg | ORAL_CAPSULE | Freq: Four times a day (QID) | ORAL | Status: DC | PRN

## 2013-03-30 MED ORDER — IBUPROFEN 600 MG PO TABS: 600.0000 mg | ORAL_TABLET | Freq: Four times a day (QID) | ORAL | Status: DC | PRN

## 2013-03-30 MED ORDER — PHENYLEPHRINE 40 MCG/ML (10ML) SYRINGE FOR IV PUSH (FOR BLOOD PRESSURE SUPPORT)
80.0000 ug | PREFILLED_SYRINGE | INTRAVENOUS | Status: DC | PRN
  Filled 2013-03-30: qty 2

## 2013-03-30 MED ORDER — ONDANSETRON HCL 4 MG/2ML IJ SOLN: 4.0000 mg | Freq: Four times a day (QID) | INTRAMUSCULAR | Status: DC | PRN

## 2013-03-30 MED ORDER — FENTANYL 2.5 MCG/ML BUPIVACAINE 1/10 % EPIDURAL INFUSION (WH - ANES)
14.0000 mL/h | INTRAMUSCULAR | Status: DC | PRN
  Administered 2013-03-30: 14 mL/h via EPIDURAL

## 2013-03-30 MED ORDER — OXYCODONE-ACETAMINOPHEN 5-325 MG PO TABS: 1.0000 | ORAL_TABLET | ORAL | Status: DC | PRN

## 2013-03-30 MED ORDER — OXYCODONE-ACETAMINOPHEN 5-325 MG PO TABS
1.0000 | ORAL_TABLET | ORAL | Status: DC | PRN
  Administered 2013-03-30 – 2013-03-31 (×5): 1 via ORAL
  Administered 2013-04-01 (×3): 2 via ORAL
  Filled 2013-03-30: qty 2
  Filled 2013-03-30: qty 1
  Filled 2013-03-30: qty 2
  Filled 2013-03-30 (×2): qty 1
  Filled 2013-03-30: qty 2
  Filled 2013-03-30 (×2): qty 1

## 2013-03-30 MED ORDER — DIBUCAINE 1 % RE OINT: 1.0000 "application " | TOPICAL_OINTMENT | RECTAL | Status: DC | PRN

## 2013-03-30 MED ORDER — DIPHENHYDRAMINE HCL 50 MG/ML IJ SOLN: 12.5000 mg | INTRAMUSCULAR | Status: DC | PRN

## 2013-03-30 MED ORDER — LANOLIN HYDROUS EX OINT: TOPICAL_OINTMENT | CUTANEOUS | Status: DC | PRN

## 2013-03-30 MED ORDER — FLEET ENEMA 7-19 GM/118ML RE ENEM: 1.0000 | ENEMA | RECTAL | Status: DC | PRN

## 2013-03-30 MED ORDER — SENNOSIDES-DOCUSATE SODIUM 8.6-50 MG PO TABS
2.0000 | ORAL_TABLET | ORAL | Status: DC
  Administered 2013-03-30 – 2013-03-31 (×2): 2 via ORAL
  Filled 2013-03-30 (×2): qty 2

## 2013-03-30 MED ORDER — PHENYLEPHRINE 40 MCG/ML (10ML) SYRINGE FOR IV PUSH (FOR BLOOD PRESSURE SUPPORT)
80.0000 ug | PREFILLED_SYRINGE | INTRAVENOUS | Status: DC | PRN
  Filled 2013-03-30: qty 2
  Filled 2013-03-30: qty 10

## 2013-03-30 MED ORDER — OXYTOCIN 40 UNITS IN LACTATED RINGERS INFUSION - SIMPLE MED
62.5000 mL/h | INTRAVENOUS | Status: DC
  Administered 2013-03-30: 999 mL/h via INTRAVENOUS
  Filled 2013-03-30: qty 1000

## 2013-03-30 MED ORDER — FENTANYL 2.5 MCG/ML BUPIVACAINE 1/10 % EPIDURAL INFUSION (WH - ANES)
INTRAMUSCULAR | Status: AC
  Filled 2013-03-30: qty 125

## 2013-03-30 MED ORDER — PENICILLIN G POTASSIUM 5000000 UNITS IJ SOLR
5.0000 10*6.[IU] | Freq: Once | INTRAVENOUS | Status: AC
  Administered 2013-03-30: 5 10*6.[IU] via INTRAVENOUS
  Filled 2013-03-30: qty 5

## 2013-03-30 MED ORDER — SIMETHICONE 80 MG PO CHEW: 80.0000 mg | CHEWABLE_TABLET | ORAL | Status: DC | PRN

## 2013-03-30 MED ORDER — PRENATAL MULTIVITAMIN CH
1.0000 | ORAL_TABLET | Freq: Every day | ORAL | Status: DC
  Administered 2013-03-31 – 2013-04-01 (×2): 1 via ORAL
  Filled 2013-03-30 (×2): qty 1

## 2013-03-30 MED ORDER — EPHEDRINE 5 MG/ML INJ
10.0000 mg | INTRAVENOUS | Status: DC | PRN
  Filled 2013-03-30: qty 2

## 2013-03-30 MED ORDER — DEXTROSE 5 % IV SOLN
2.5000 10*6.[IU] | INTRAVENOUS | Status: DC
  Administered 2013-03-30: 2.5 10*6.[IU] via INTRAVENOUS
  Filled 2013-03-30 (×4): qty 2.5

## 2013-03-30 MED ORDER — BUTORPHANOL TARTRATE 1 MG/ML IJ SOLN
INTRAMUSCULAR | Status: AC
  Filled 2013-03-30: qty 1

## 2013-03-30 MED ORDER — ONDANSETRON HCL 4 MG/2ML IJ SOLN: 4.0000 mg | INTRAMUSCULAR | Status: DC | PRN

## 2013-03-30 MED ORDER — LIDOCAINE HCL (PF) 1 % IJ SOLN
INTRAMUSCULAR | Status: DC | PRN
  Administered 2013-03-30 (×4): 4 mL

## 2013-03-30 NOTE — MAU Note (Signed)
Patient states she is having contractions every 5 minutes with a little vaginal discharge. Denies bleeding. Reports good fetal movement.

## 2013-03-30 NOTE — Progress Notes (Signed)
Dr cousins called and she is notified of patient presenting complains of painful contractions. She is aware of tracing, ctx pattern and sve result. Order to have patient ambulate for an hour and recheck cervix.

## 2013-03-30 NOTE — Anesthesia Procedure Notes (Signed)
Epidural Patient location during procedure: OB Start time: 03/30/2013 2:16 PM  Staffing Performed by: anesthesiologist   Preanesthetic Checklist Completed: patient identified, site marked, surgical consent, pre-op evaluation, timeout performed, IV checked, risks and benefits discussed and monitors and equipment checked  Epidural Patient position: sitting Prep: site prepped and draped and DuraPrep Patient monitoring: continuous pulse ox and blood pressure Approach: midline Injection technique: LOR air  Needle:  Needle type: Tuohy  Needle gauge: 17 G Needle length: 9 cm and 9 Needle insertion depth: 6 cm Catheter type: closed end flexible Catheter size: 19 Gauge Catheter at skin depth: 11 cm Test dose: negative  Assessment Events: blood not aspirated, injection not painful, no injection resistance, negative IV test and no paresthesia  Additional Notes Discussed risk of headache, infection, bleeding, nerve injury and failed or incomplete block.  Patient voices understanding and wishes to proceed.  Epidural placed easily on first attempt.  No paresthesia.  Patient tolerated procedure well with no apparent complications.  Jasmine December, MDReason for block:procedure for pain

## 2013-03-30 NOTE — Progress Notes (Signed)
S: epidural in place. Notes some pelvic pain but feels better  O: BP 106/55 VE: 7-8/100/0 bulging membrane (+) asyclytic  Tracing: baseline 135 (+) accels Ctx  q 3-6 mins plt 92K IMP: active phase w/ cervical change despite irreg ctx GBS cx (+) on IV PCN Gestational thrombocytopenia P) exaggerated right sims position. Cont present mgmt.

## 2013-03-30 NOTE — Anesthesia Preprocedure Evaluation (Signed)
Anesthesia Evaluation  Patient identified by MRN, date of birth, ID band Patient awake    Reviewed: Allergy & Precautions, H&P , NPO status , Patient's Chart, lab work & pertinent test results, reviewed documented beta blocker date and time   History of Anesthesia Complications Negative for: history of anesthetic complications  Airway Mallampati: III TM Distance: >3 FB Neck ROM: full    Dental  (+) Teeth Intact   Pulmonary neg pulmonary ROS,  breath sounds clear to auscultation        Cardiovascular negative cardio ROS  Rhythm:regular Rate:Normal     Neuro/Psych negative neurological ROS  negative psych ROS   GI/Hepatic Neg liver ROS, GERD-  Medicated,  Endo/Other  Morbid obesity  Renal/GU negative Renal ROS  negative genitourinary   Musculoskeletal   Abdominal   Peds  Hematology  (+) Blood dyscrasia (thrombocytopenia - plt 92), ,   Anesthesia Other Findings   Reproductive/Obstetrics (+) Pregnancy                           Anesthesia Physical Anesthesia Plan  ASA: III  Anesthesia Plan: Epidural   Post-op Pain Management:    Induction:   Airway Management Planned:   Additional Equipment:   Intra-op Plan:   Post-operative Plan:   Informed Consent: I have reviewed the patients History and Physical, chart, labs and discussed the procedure including the risks, benefits and alternatives for the proposed anesthesia with the patient or authorized representative who has indicated his/her understanding and acceptance.     Plan Discussed with:   Anesthesia Plan Comments:         Anesthesia Quick Evaluation

## 2013-03-30 NOTE — Progress Notes (Signed)
Dr cousins called and notified that patient is very uncomfortable and cervical exam. Order to admit to L/D unit.

## 2013-03-30 NOTE — H&P (Signed)
Melissa Li is a 27 y.o. female presenting @ 39 2/7 weeks in labor. Intact membrane. PNC complicated by gestational thrombocytopenia  History OB History   Grav Para Term Preterm Abortions TAB SAB Ect Mult Living   2 1 1  0 0 0 0 0 0 1     Past Medical History  Diagnosis Date  . Pregnancy, supervision of, high-risk 01/14/2013  . Thrombocytopenia complicating pregnancy 2010   History reviewed. No pertinent past surgical history. Family History: family history is not on file. Social History:  reports that she has never smoked. She does not have any smokeless tobacco history on file. She reports that she drinks alcohol. She reports that she does not use illicit drugs.   Prenatal Transfer Tool  Maternal Diabetes: No Genetic Screening: Declined Maternal Ultrasounds/Referrals: Normal Fetal Ultrasounds or other Referrals:  None Maternal Substance Abuse:  No Significant Maternal Medications:  None Significant Maternal Lab Results:  Lab values include: Group B Strep positive Other Comments:  SS trait. FOB neg...: gest thrombocytopenia  ROS neg  Dilation: 6 Effacement (%): 90 Station: -1 Exam by:: Melissa Lucks, RN Blood pressure 113/51, pulse 83, temperature 98.5 F (36.9 C), temperature source Oral, resp. rate 18, height 5\' 4"  (1.626 m), weight 104.055 kg (229 lb 6.4 oz), SpO2 100.00%. Maternal Exam:  Uterine Assessment: Contraction strength is moderate.  Abdomen: Patient reports no abdominal tenderness. Fetal presentation: vertex  Introitus: Normal vulva. Pelvis: adequate for delivery.   Cervix: Cervix evaluated by digital exam.     Physical Exam  Constitutional: She is oriented to person, place, and time. She appears well-developed and well-nourished.  Eyes: EOM are normal.  Neck: Neck supple.  GI: Soft.  Musculoskeletal: She exhibits no edema.  Neurological: She is alert and oriented to person, place, and time.  Skin: Skin is warm and dry.  Psychiatric: She has a normal mood  and affect.    Prenatal labs: ABO, Rh: O/Positive/-- (05/09 0000) Antibody: Negative (05/09 0000) Rubella: Immune (05/09 0000) RPR: Nonreactive (05/09 0000)  HBsAg: Negative (05/09 0000)  HIV: Non-reactive (05/09 0000)  GBS: Positive (10/28 0000)   Assessment/Plan: Labor Gestational thrombocytopenia GBS cx (+) SS trait P) admit routine labs. IV PCN. Epidural. Pitocin prn. Amniotomy prn  Melissa Li A 03/30/2013, 4:08 PM

## 2013-03-30 NOTE — MAU Note (Signed)
Patient is in for labor eval. She states that she has been having frequent and painful contraction since 6am. She denies vaginal bleeding or LOF. She is asking for epidural (breathing  Technique demonstrated with patient). She reports good fetal movement.

## 2013-03-31 ENCOUNTER — Encounter (HOSPITAL_COMMUNITY): Payer: Self-pay | Admitting: Obstetrics and Gynecology

## 2013-03-31 LAB — CBC
HCT: 31.3 % — ABNORMAL LOW (ref 36.0–46.0)
Hemoglobin: 10.8 g/dL — ABNORMAL LOW (ref 12.0–15.0)
MCV: 74.5 fL — ABNORMAL LOW (ref 78.0–100.0)
Platelets: 84 10*3/uL — ABNORMAL LOW (ref 150–400)
RBC: 4.2 MIL/uL (ref 3.87–5.11)
RDW: 14.5 % (ref 11.5–15.5)
WBC: 9.3 10*3/uL (ref 4.0–10.5)

## 2013-03-31 NOTE — Anesthesia Postprocedure Evaluation (Signed)
  Anesthesia Post-op Note  Patient: Melissa Li  Procedure(s) Performed: * No procedures listed *  Patient Location: PACU and Mother/Baby  Anesthesia Type:Epidural  Level of Consciousness: awake, alert  and oriented  Airway and Oxygen Therapy: Patient Spontanous Breathing  Post-op Pain: none  Post-op Assessment: Post-op Vital signs reviewed, Patient's Cardiovascular Status Stable, No headache, No backache, No residual numbness and No residual motor weakness  Post-op Vital Signs: Reviewed and stable  Complications: No apparent anesthesia complications

## 2013-03-31 NOTE — Progress Notes (Signed)
Patient ID: Melissa Li, female   DOB: 05-Jul-1985, 27 y.o.   MRN: [id number] PPD # 1  Subjective: Pt reports feeling well/ Pain controlled with percocet Tolerating po/ Voiding without problems/ No n/v Bleeding is moderate Newborn info:  Information for the patient's newborn:  Melissa, Li [[id number]]  female Feeding: breast   Objective:  VS: Blood pressure 107/70, pulse 73, temperature 97.9 F (36.6 C), temperature source Oral, resp. rate 18.    Recent Labs  03/30/13 1951 03/31/13 0620  WBC 11.6* 9.3  HGB 11.4* 10.8*  HCT 33.1* 31.3*  PLT 92* 84*    Blood type: O POS (11/24 1720) Rubella: Immune (05/09 0000)    Physical Exam:  General:  alert, cooperative and no distress CV: Regular rate and rhythm Resp: clear Abdomen: soft, nontender, normal bowel sounds Uterine Fundus: firm, below umbilicus, nontender Perineum: is normal Lochia: moderate Ext: edema trace and Homans sign is negative, no sign of DVT   A/P: PPD # 1/ G2P2002/ S/P: SVD Hx Gestational thrombocytopenia Doing well Continue routine post partum orders Anticipate D/C home in AM    Melissa Revel, MSN, [facility] 03/31/2013, 9:18 AM

## 2013-04-01 LAB — CBC
HCT: 33.7 % — ABNORMAL LOW (ref 36.0–46.0)
Hemoglobin: 11.7 g/dL — ABNORMAL LOW (ref 12.0–15.0)
MCH: 25.9 pg — ABNORMAL LOW (ref 26.0–34.0)
MCHC: 34.7 g/dL (ref 30.0–36.0)
MCV: 74.7 fL — ABNORMAL LOW (ref 78.0–100.0)
Platelets: 93 10*3/uL — ABNORMAL LOW (ref 150–400)
RBC: 4.51 MIL/uL (ref 3.87–5.11)
RDW: 14.6 % (ref 11.5–15.5)
WBC: 8.3 10*3/uL (ref 4.0–10.5)

## 2013-04-01 MED ORDER — SENNOSIDES-DOCUSATE SODIUM 8.6-50 MG PO TABS: 2.0000 | ORAL_TABLET | ORAL | Status: DC

## 2013-04-01 MED ORDER — OXYCODONE-ACETAMINOPHEN 5-325 MG PO TABS: 1.0000 | ORAL_TABLET | Freq: Four times a day (QID) | ORAL | Status: DC | PRN

## 2013-04-01 MED ORDER — FERROUS SULFATE 325 (65 FE) MG PO TABS: 325.0000 mg | ORAL_TABLET | Freq: Every day | ORAL | Status: DC

## 2013-04-01 NOTE — Discharge Summary (Signed)
Obstetric Discharge Summary Reason for Admission: G2 P1 @ 39wks 2 days in active labor; Gestational thrombocytopenia Prenatal Procedures: NST and ultrasound Intrapartum Procedures: spontaneous vaginal delivery Postpartum Procedures: none Complications-Operative and Postpartum: none HGB  Date Value Range Status  03/23/2013 11.2* 11.6 - 15.9 g/dL Final     Hemoglobin  Date Value Range Status  03/31/2013 10.8* 12.0 - 15.0 g/dL Final     HCT  Date Value Range Status  03/31/2013 31.3* 36.0 - 46.0 % Final  03/23/2013 33.1* 34.8 - 46.6 % Final    Physical Exam:  General: alert, cooperative and no distress Lochia: appropriate Uterine Fundus: firm Incision: n/a DVT Evaluation: No evidence of DVT seen on physical exam. Negative Homan's sign.  Discharge Diagnoses: G2 P2 s/p SVD; Chronic anemia worsened by ABL anemia        Gestational thrombocytopenia; plt 93 at d/c; will manage cramping with percocet         Mild ABL anemia; will d/c home with fe supplement  Discharge Information: Date: 04/01/2013 Activity: pelvic rest Diet: routine Medications: PNV, Colace, percocet and Iron Condition: stable Instructions: refer to practice specific booklet Discharge to: home Follow-up Information   Follow up with Rianne Degraaf A, MD In 6 weeks.   Specialty:  Obstetrics and Gynecology   Contact information:   [address] [place] [address] [phone]       Newborn Data: Live born female on 03/30/13 Birth Weight: 5 lb 3.8 oz (2375 g) APGAR: 8, 9  Home with mother.  FISHER,JULIE K 04/01/2013, 8:55 AM

## 2013-04-01 NOTE — Progress Notes (Signed)
Patient ID: Melissa Li, female   DOB: Jan 07, 1986, 27 y.o.   MRN: [id number] PPD # 2  Subjective: Pt reports feeling well and eager for d/c home/ Pain controlled with percocet Tolerating po/ Voiding without problems/ No n/v Bleeding is light/ Newborn info:  Information for the patient's newborn:  Melissa Li, Melissa Li [[id number]]  female Feeding: breast    Objective:  VS: Blood pressure 103/69, pulse 68, temperature 97.9 F (36.6 C), temperature source Oral, resp. rate 18.    Recent Labs  03/30/13 1951 03/31/13 0620  WBC 11.6* 9.3  HGB 11.4* 10.8*  HCT 33.1* 31.3*  PLT 92* 84*    Blood type: O POS (11/24 1720) Rubella: Immune (05/09 0000)    Physical Exam:  General:  alert, cooperative and no distress CV: Regular rate and rhythm Resp: clear Abdomen: soft, nontender, normal bowel sounds Uterine Fundus: firm, below umbilicus, nontender Perineum: is normal Lochia: minimal Ext: Homans sign is negative, no sign of DVT and no edema, redness or tenderness in the calves or thighs    A/P: PPD # 2/ G2P2002/ S/P: SVD Gestational thrombocytopenia; stable, but plts <100; will d/c with some percocet, but no rx for ibuprofen Doing well and stable for discharge home RX: Niferex 150mg  po QD/BID #30/#60 Refill x 1 Percocet 5/325 1 to 2 po Q 6 hrs prn pain #15 No refill WOB/GYN booklet given Routine pp visit in 6wks   Demetrius Revel, MSN, [facility] 04/01/2013, 8:51 AM

## 2013-04-06 ENCOUNTER — Other Ambulatory Visit: Payer: Managed Care, Other (non HMO)

## 2013-04-06 ENCOUNTER — Telehealth: Payer: Self-pay | Admitting: Oncology

## 2013-04-06 NOTE — Telephone Encounter (Signed)
pt called adv baby has been delivered she will keep 12/12 appts but cancel 12/1 I did this shh

## 2013-04-17 ENCOUNTER — Ambulatory Visit (HOSPITAL_BASED_OUTPATIENT_CLINIC_OR_DEPARTMENT_OTHER): Payer: Managed Care, Other (non HMO) | Admitting: Oncology

## 2013-04-17 ENCOUNTER — Other Ambulatory Visit (HOSPITAL_BASED_OUTPATIENT_CLINIC_OR_DEPARTMENT_OTHER): Payer: Managed Care, Other (non HMO)

## 2013-04-17 VITALS — BP 118/82 | HR 88 | Temp 97.7°F | Resp 18 | Ht 64.0 in | Wt 214.2 lb

## 2013-04-17 DIAGNOSIS — O0990 Supervision of high risk pregnancy, unspecified, unspecified trimester: Secondary | ICD-10-CM

## 2013-04-17 DIAGNOSIS — D696 Thrombocytopenia, unspecified: Secondary | ICD-10-CM

## 2013-04-17 LAB — CBC WITH DIFFERENTIAL/PLATELET
Basophils Absolute: 0 10*3/uL (ref 0.0–0.1)
EOS%: 2.3 % (ref 0.0–7.0)
HCT: 37.8 % (ref 34.8–46.6)
HGB: 12.9 g/dL (ref 11.6–15.9)
LYMPH%: 26.5 % (ref 14.0–49.7)
MCH: 26 pg (ref 25.1–34.0)
MCV: 76.2 fL — ABNORMAL LOW (ref 79.5–101.0)
MONO%: 11.7 % (ref 0.0–14.0)
NEUT%: 59.1 % (ref 38.4–76.8)
Platelets: 136 10*3/uL — ABNORMAL LOW (ref 145–400)
RBC: 4.96 10*6/uL (ref 3.70–5.45)
RDW: 14.3 % (ref 11.2–14.5)
WBC: 5.1 10*3/uL (ref 3.9–10.3)
nRBC: 0 % (ref 0–0)

## 2013-04-18 NOTE — Progress Notes (Signed)
27 year old woman felt to likely have benign thrombocytopenia of pregnancy. She just delivered her second child. Lowest platelet count recorded 84,000 the time of delivery 03/31/2013. She had a uncomplicated epidural anesthetic. Vaginal delivery. Platelet count has now rebounded and is 136,000 today. Of interest platelet count fell to approximately the same level, 85,000, at time of her first pregnancy in 2010.  Number never come back again in 6 months to check another CBC for the record and distant from any pregnancy so we get a more accurate estimate of her baseline.

## 2013-04-24 ENCOUNTER — Ambulatory Visit (HOSPITAL_COMMUNITY)
Admission: RE | Admit: 2013-04-24 | Discharge: 2013-04-24 | Disposition: A | Discharge status: Home / Self Care | Payer: Managed Care, Other (non HMO) | Source: Ambulatory Visit | Attending: Obstetrics and Gynecology | Admitting: Obstetrics and Gynecology

## 2013-04-24 NOTE — Lactation Note (Addendum)
Adult Lactation Consultation Outpatient Visit Note  Patient Name: Melissa Li                                             "N'riah" Date of Birth: March 09, 1986                                                           3 weeks today Gestational Age at Delivery: Unknown                                     Weight today: 6-14.3, 3126 Type of Delivery: vaginal del  Breastfeeding History: Frequency of Breastfeeding: 2-3 hours and at night 3-4 hours Length of Feeding: 10-15 mins on one breast Voids: QS Stools: QS  Supplementing / Method: once a day she takes 1-2 ounces to give her Vit D Pumping:  Type of Pump:Medela Freestyle and Medela Pump N Style   Frequency:3-4 times a day but some days none  Volume:  4-5 ounces              Milk in refrigerator, 12 ounces, and   80 ounces in freezer  Comments: Mothers nipples are sore . She is concerned about her milk supply. Mother has been exclusively breast feeding. Mother plans to go back to work soon .Mother breatfed for 7 months and then when she went back to work she noticed a big decrease in milk volume.     Consultation Evaluation: Mothers breast are firm and full. Assist mother with latching infant in cross cradle hold. Mother needed lots of assistance to get good deep latch. Mother was taught how to adjust lower jaw for wider gape. She has been pinching her nipple to latch and infant has been having a shallow latch. Infant cheeks dimple while feeding. Infant has a high palate .observed consistent audible swallows. Infant sustained latch for 18-20 mins   Initial Feeding Assessment: Pre-feed Weight:3126 Post-feed Weight: 3220 Amount Transferred:94 ml Comments:  Additional Feeding Assessment:infant placed on 2nd breast in football hold. Infant sustained latch for 10 mins.  Pre-feed Weight:3220 Post-feed Weight:3240 Amount Transferred:20 ml Comments:   Total Breast milk Transferred this Visit: 114 ml Total Supplement Given:   Additional  Interventions:  Advised mother to continue to cue base feed. Encouraged to work on good depth with each feeding. Adjust infants lower jaw for wider gape Advised to use c or u hold.  Recommend that mother pump 1-2 times daily Suggested that mother begin a supplement for increasing milk volume if needed when returning to work Follow up PRN    Follow-Up  PRN     Michel Bickers 04/24/2013, 10:33 AM

## 2013-07-04 ENCOUNTER — Encounter: Payer: Self-pay | Admitting: Oncology

## 2013-09-08 ENCOUNTER — Other Ambulatory Visit: Payer: Managed Care, Other (non HMO)

## 2013-10-27 ENCOUNTER — Encounter: Payer: Self-pay | Admitting: Oncology

## 2013-10-27 ENCOUNTER — Other Ambulatory Visit: Payer: Self-pay | Admitting: Oncology

## 2013-10-27 ENCOUNTER — Ambulatory Visit (INDEPENDENT_AMBULATORY_CARE_PROVIDER_SITE_OTHER): Payer: Managed Care, Other (non HMO) | Admitting: Oncology

## 2013-10-27 VITALS — BP 117/80 | HR 74 | Temp 98.0°F | Resp 20 | Ht 64.0 in | Wt 219.7 lb

## 2013-10-27 DIAGNOSIS — D696 Thrombocytopenia, unspecified: Secondary | ICD-10-CM

## 2013-10-27 LAB — CBC WITH DIFFERENTIAL/PLATELET
Basophils Absolute: 0 10*3/uL (ref 0.0–0.1)
Basophils Relative: 0 % (ref 0–1)
Eosinophils Absolute: 0.1 10*3/uL (ref 0.0–0.7)
Eosinophils Relative: 1 % (ref 0–5)
HCT: 35.5 % — ABNORMAL LOW (ref 36.0–46.0)
Hemoglobin: 12.1 g/dL (ref 12.0–15.0)
LYMPHS ABS: 1.5 10*3/uL (ref 0.7–4.0)
LYMPHS PCT: 29 % (ref 12–46)
MCH: 26.3 pg (ref 26.0–34.0)
MCHC: 34.1 g/dL (ref 30.0–36.0)
MCV: 77.2 fL — ABNORMAL LOW (ref 78.0–100.0)
MONOS PCT: 7 % (ref 3–12)
Monocytes Absolute: 0.4 10*3/uL (ref 0.1–1.0)
NEUTROS ABS: 3.3 10*3/uL (ref 1.7–7.7)
NEUTROS PCT: 63 % (ref 43–77)
Platelets: 125 10*3/uL — ABNORMAL LOW (ref 150–400)
RBC: 4.6 MIL/uL (ref 3.87–5.11)
RDW: 14.3 % (ref 11.5–15.5)
WBC: 5.2 10*3/uL (ref 4.0–10.5)

## 2013-10-27 LAB — SAVE SMEAR

## 2013-10-27 NOTE — Patient Instructions (Signed)
Lab today - we will call you with results If everything looks good - no need to return to Hematologist until next time you are pregnant or as needed

## 2013-10-27 NOTE — Progress Notes (Signed)
Patient ID: Melissa CarneyRonda S Orosz, female   DOB: 1985-10-17, 28 y.o.   MRN: [id number] Pleasant 28 year old woman here today for brief followup to see if her platelet count has fully recovered. She has a history what is likely gestational thrombocytopenia occurring during her last 2 pregnancies. Platelet count never lower than 84,000. Large platelets noted on smear so actual platelet count likely higher than that. No bleeding complications with epidural anesthesia. The platelet count following delivery of her baby back in November 2014 was 93,000 with subsequent value of 136,000 on followup 04/17/2013. She has had no clinical bleeding. She is using an IUD so she is not menstruating.  Physical exam not done today  Lab: Hemoglobin 12.1, hematocrit 35.5, MCV 77, white count 5200, 63% neutrophils, 29 lymphocytes, 7 monocytes, platelet count 125,000.  Impression: She appears to have chronic mild thrombocytopenia now unrelated to pregnancy. Liver function tests were normal 01/26/2013. She is on no medications known to be associated with a low platelet count. She has not had clinical splenomegaly on previous exams. She has no other known medical illness. There is no family history of any blood disorder. I do not think further evaluation is indicated at this time. Blood count should be monitored periodically on an annual basis.

## 2013-10-28 NOTE — Progress Notes (Signed)
Patient ID: Lupe CarneyRonda S Carbajal, female   DOB: 04-05-1986, 28 y.o.   MRN: [id number] Review of the peripheral blood film: Majority of platelets are very large. This will spuriously lower  the machine count.  Machine count was 125,000.

## 2014-03-08 ENCOUNTER — Encounter: Payer: Self-pay | Admitting: Oncology

## 2016-06-23 ENCOUNTER — Encounter (HOSPITAL_BASED_OUTPATIENT_CLINIC_OR_DEPARTMENT_OTHER): Payer: Self-pay | Admitting: *Deleted

## 2016-06-23 ENCOUNTER — Emergency Department (HOSPITAL_BASED_OUTPATIENT_CLINIC_OR_DEPARTMENT_OTHER)
Admission: EM | Admit: 2016-06-23 | Discharge: 2016-06-23 | Disposition: A | Discharge status: Home / Self Care | Payer: Managed Care, Other (non HMO) | Attending: Physician Assistant | Admitting: Physician Assistant

## 2016-06-23 DIAGNOSIS — R42 Dizziness and giddiness: Secondary | ICD-10-CM | POA: Diagnosis not present

## 2016-06-23 LAB — URINALYSIS, ROUTINE W REFLEX MICROSCOPIC
BILIRUBIN URINE: NEGATIVE
Glucose, UA: NEGATIVE mg/dL
HGB URINE DIPSTICK: NEGATIVE
Ketones, ur: NEGATIVE mg/dL
Leukocytes, UA: NEGATIVE
Nitrite: NEGATIVE
Protein, ur: NEGATIVE mg/dL
SPECIFIC GRAVITY, URINE: 1.007 (ref 1.005–1.030)
pH: 7 (ref 5.0–8.0)

## 2016-06-23 LAB — CBC WITH DIFFERENTIAL/PLATELET
Basophils Absolute: 0 10*3/uL (ref 0.0–0.1)
Basophils Relative: 0 %
EOS ABS: 0.1 10*3/uL (ref 0.0–0.7)
Eosinophils Relative: 1 %
HEMATOCRIT: 35.2 % — AB (ref 36.0–46.0)
HEMOGLOBIN: 12 g/dL (ref 12.0–15.0)
LYMPHS ABS: 1.2 10*3/uL (ref 0.7–4.0)
Lymphocytes Relative: 19 %
MCH: 26.5 pg (ref 26.0–34.0)
MCHC: 34.1 g/dL (ref 30.0–36.0)
MCV: 77.9 fL — ABNORMAL LOW (ref 78.0–100.0)
Monocytes Absolute: 0.6 10*3/uL (ref 0.1–1.0)
Monocytes Relative: 10 %
NEUTROS ABS: 4.3 10*3/uL (ref 1.7–7.7)
Neutrophils Relative %: 70 %
Platelets: 134 10*3/uL — ABNORMAL LOW (ref 150–400)
RBC: 4.52 MIL/uL (ref 3.87–5.11)
RDW: 13.2 % (ref 11.5–15.5)
WBC: 6.1 10*3/uL (ref 4.0–10.5)

## 2016-06-23 LAB — BASIC METABOLIC PANEL
Anion gap: 6 (ref 5–15)
BUN: 9 mg/dL (ref 6–20)
CO2: 27 mmol/L (ref 22–32)
Calcium: 9.1 mg/dL (ref 8.9–10.3)
Chloride: 106 mmol/L (ref 101–111)
Creatinine, Ser: 0.98 mg/dL (ref 0.44–1.00)
GFR calc Af Amer: >60 mL/min (ref >60)
Glucose, Bld: 119 mg/dL — ABNORMAL HIGH (ref 65–99)
Potassium: 3.3 mmol/L — ABNORMAL LOW (ref 3.5–5.1)
SODIUM: 139 mmol/L (ref 135–145)

## 2016-06-23 LAB — PREGNANCY, URINE: Preg Test, Ur: NEGATIVE

## 2016-06-23 LAB — TROPONIN I: Troponin I: <0.03 ng/mL (ref <0.03)

## 2016-06-23 MED ORDER — MECLIZINE HCL 25 MG PO TABS: 25.0000 mg | ORAL_TABLET | Freq: Three times a day (TID) | ORAL | 0 refills | Status: DC | PRN

## 2016-06-23 MED ORDER — SODIUM CHLORIDE 0.9 % IV BOLUS (SEPSIS)
1000.0000 mL | Freq: Once | INTRAVENOUS | Status: AC
  Administered 2016-06-23: 1000 mL via INTRAVENOUS

## 2016-06-23 MED ORDER — MECLIZINE HCL 25 MG PO TABS
25.0000 mg | ORAL_TABLET | Freq: Once | ORAL | Status: AC
  Administered 2016-06-23: 25 mg via ORAL
  Filled 2016-06-23: qty 1

## 2016-06-23 NOTE — ED Provider Notes (Signed)
[facility] Provider Note   CSN: [id number] Arrival date & time: 06/23/16  1611  By signing my name below, I, Octavia Heir, attest that this documentation has been prepared under the direction and in the presence of Sharilyn Sites, PA-C.  Electronically Signed: Octavia Heir, ED Scribe. 06/23/16. 5:06 PM.    History   Chief Complaint Chief Complaint  Patient presents with  . Dizziness   The history is provided by the patient. No language interpreter was used.   HPI Comments: Melissa Li is a 31 y.o. female who presents to the Emergency Department complaining of acute onset, moderate, light-headedness x 2-3 hours ago. Pt reports associated dizziness. She describes the dizziness as room-spinning. She notes her light-headedness is exacerbated when moving her head and ambulating. Pt has not had any falls or injuries noted. She also states she has not had similar symptoms in the past. Pt denies nausea or vomiting.  No chest pain or SOB.  No focal numbness, weakness, changes in speech, blurred vision, confusion, tinnitus, headache, or neck pain.  No fever, chills.  No recent illness.  No other significant medical issues.  Does have known thrombocytopenia which is under surveillance.  Past Medical History:  Diagnosis Date  . Postpartum care following vaginal delivery (03/30/13) 03/31/2013  . Pregnancy, supervision of, high-risk 01/14/2013  . SVD (spontaneous vaginal delivery) 03/31/2013  . Thrombocytopenia complicating pregnancy ([facility]) 2010    Patient Active Problem List   Diagnosis Date Noted  . Postpartum care following vaginal delivery (03/30/13) 03/31/2013  . SVD (spontaneous vaginal delivery) 03/31/2013  . Thrombocytopenia, unspecified 01/14/2013  . Pregnancy, supervision of, high-risk 01/14/2013    History reviewed. No pertinent surgical history.  OB History    Gravida Para Term Preterm AB Living   2 2 2  0 0 2   SAB TAB Ectopic Multiple Live Births   0 0 0 0 1        Home Medications    Prior to Admission medications   Not on File    Family History History reviewed. No pertinent family history.  Social History Social History  Substance Use Topics  . Smoking status: Never Smoker  . Smokeless tobacco: Not on file  . Alcohol use Yes     Allergies   Patient has no known allergies.   Review of Systems Review of Systems  Gastrointestinal: Negative for nausea and vomiting.  Neurological: Positive for dizziness and light-headedness.  All other systems reviewed and are negative.    Physical Exam Updated Vital Signs BP 103/55 (BP Location: Right Arm)   Pulse 79   Temp 98.2 F (36.8 C) (Oral)   Resp 18   Ht 5\' 3"  (1.6 m)   Wt 210 lb (95.3 kg)   LMP 06/07/2016   SpO2 100%   BMI 37.20 kg/m   Physical Exam  Constitutional: She is oriented to person, place, and time. She appears well-developed and well-nourished.  HENT:  Head: Normocephalic and atraumatic.  Mouth/Throat: Oropharynx is clear and moist.  Eyes: Conjunctivae and EOM are normal. Pupils are equal, round, and reactive to light.  EOMs fully intact, slight horizontal nystagmus, no field cuts, normal confrontation  Neck: Normal range of motion.  Cardiovascular: Normal rate, regular rhythm and normal heart sounds.   Pulmonary/Chest: Effort normal and breath sounds normal.  Abdominal: Soft. Bowel sounds are normal.  Musculoskeletal: Normal range of motion.  Neurological: She is alert and oriented to person, place, and time.  AAOx3, answering questions and following  commands appropriately; equal strength UE and LE bilaterally; CN grossly intact; moves all extremities appropriately without ataxia; no focal neuro deficits or facial asymmetry appreciated  Skin: Skin is warm and dry.  Psychiatric: She has a normal mood and affect.  Nursing note and vitals reviewed.    ED Treatments / Results  DIAGNOSTIC STUDIES: Oxygen Saturation is 100% on RA, normal by my  interpretation.  COORDINATION OF CARE:  5:05 PM Discussed treatment plan with pt at bedside and pt agreed to plan.  Labs (all labs ordered are listed, but only abnormal results are displayed) Labs Reviewed  CBC WITH DIFFERENTIAL/PLATELET - Abnormal; Notable for the following:       Result Value   HCT 35.2 (*)    MCV 77.9 (*)    Platelets 134 (*)    All other components within normal limits  BASIC METABOLIC PANEL - Abnormal; Notable for the following:    Potassium 3.3 (*)    Glucose, Bld 119 (*)    All other components within normal limits  TROPONIN I  PREGNANCY, URINE  URINALYSIS, ROUTINE W REFLEX MICROSCOPIC    EKG  EKG Interpretation None       Radiology No results found.  Procedures Procedures (including critical care time)  Medications Ordered in ED Medications  sodium chloride 0.9 % bolus 1,000 mL (0 mLs Intravenous Stopped 06/23/16 1815)  meclizine (ANTIVERT) tablet 25 mg (25 mg Oral Given 06/23/16 1715)     Initial Impression / Assessment and Plan / ED Course  I have reviewed the triage vital signs and the nursing notes.  Pertinent labs & imaging results that were available during my care of the patient were reviewed by me and considered in my medical decision making (see chart for details).  31 year old female here with dizziness. Onset today. Reports worse with movement, room spinning sensation. She is afebrile and nontoxic. Neurologic exam is nonfocal. She does have slight horizontal nystagmus. Patient is young, otherwise healthy. No significant risk factors for stroke. Feel this is likely peripheral vertigo. Will plan for treatment with IV fluids and meclizine. Basic labs pending as well as EKG.  7:24 PM ekg non-ischemic.  Lab work reassuring.  Her Thrombocytopenia is at baseline. After treatment here with IV fluids and meclizine patient is feeling significantly better. She has been able to get up and walk to the restroom unassisted. No recurrent dizziness.  Her vitals remained stable. Continue to suspect is a peripheral vertigo. Will discharge home with prescription for meclizine. Recommended to follow-up closely with PCP.  Discussed plan with patient, he/she acknowledged understanding and agreed with plan of care.  Return precautions given for new or worsening symptoms.  Final Clinical Impressions(s) / ED Diagnoses   Final diagnoses:  Dizziness    New Prescriptions Discharge Medication List as of 06/23/2016  7:29 PM    START taking these medications   Details  meclizine (ANTIVERT) 25 MG tablet Take 1 tablet (25 mg total) by mouth 3 (three) times daily as needed for dizziness., Starting Sat 06/23/2016, Print       I personally performed the services described in this documentation, which was scribed in my presence. The recorded information has been reviewed and is accurate.   Garlon HatchetLisa M Jaymarie Yeakel, PA-C 06/23/16 2007    Courteney Randall AnLyn Mackuen, MD 06/23/16 2306

## 2016-06-23 NOTE — Discharge Instructions (Signed)
Take the prescribed medication as directed.  Make sure to stay hydrated with oral fluids. Follow-up with your primary care doctor. Return to the ED for new or worsening symptoms.

## 2016-06-23 NOTE — ED Triage Notes (Addendum)
Pt reports lightheadedness that started about 2-3 hours PTA.  States that she is able to walk but has to stop when the dizziness gets bad.  Denies pain.  Denies fall or injury.  Denies N/V/D.  CVA screen negative no droop, drift noted.

## 2016-10-15 ENCOUNTER — Encounter: Payer: Managed Care, Other (non HMO) | Admitting: Oncology

## 2019-01-07 LAB — OB RESULTS CONSOLE RUBELLA ANTIBODY, IGM
Rubella: IMMUNE
Rubella: NON-IMMUNE/NOT IMMUNE

## 2019-01-07 LAB — OB RESULTS CONSOLE ABO/RH: RH Type: POSITIVE

## 2019-01-07 LAB — OB RESULTS CONSOLE RPR: RPR: NONREACTIVE

## 2019-01-07 LAB — OB RESULTS CONSOLE GC/CHLAMYDIA
Chlamydia: NEGATIVE
Gonorrhea: NEGATIVE

## 2019-01-07 LAB — OB RESULTS CONSOLE ANTIBODY SCREEN: Antibody Screen: NEGATIVE

## 2019-01-07 LAB — OB RESULTS CONSOLE HIV ANTIBODY (ROUTINE TESTING): HIV: NONREACTIVE

## 2019-01-07 LAB — OB RESULTS CONSOLE HEPATITIS B SURFACE ANTIGEN: Hepatitis B Surface Ag: NEGATIVE

## 2019-05-08 NOTE — L&D Delivery Note (Signed)
Operative Delivery Note At 1:31 AM a viable and healthy female was delivered via Vaginal, Spontaneous.  Presentation: vertex; Position: Right,, Occiput,, Anterior; Station: +4.  Verbal consent: obtained from patient.  Risks and benefits discussed in detail.  Risks include, but are not limited to the risks of anesthesia, bleeding, infection, damage to maternal tissues, fetal cephalhematoma.  There is also the risk of inability to effect vaginal delivery of the head, or shoulder dystocia that cannot be resolved by established maneuvers, leading to the need for emergency cesarean section. Indication. Fetal heart rate tracing APGAR: 9, 9; weight pending .   Placenta status:spontaneous ,intact not sent , .   Cord: none  with the following complications: .  Cord pH: n/a  Anesthesia:  epidural Instruments: mushroom Episiotomy: None Lacerations: None Suture Repair: n/a Est. Blood Loss (mL): 181  Mom to postpartum.  Baby to Couplet care / Skin to Skin.  Melissa Li A Melissa Li 07/29/2019, 2:02 AM

## 2019-05-19 DIAGNOSIS — Z3483 Encounter for supervision of other normal pregnancy, third trimester: Secondary | ICD-10-CM | POA: Diagnosis not present

## 2019-05-19 DIAGNOSIS — O09523 Supervision of elderly multigravida, third trimester: Secondary | ICD-10-CM | POA: Diagnosis not present

## 2019-05-19 DIAGNOSIS — Z23 Encounter for immunization: Secondary | ICD-10-CM | POA: Diagnosis not present

## 2019-05-19 DIAGNOSIS — Z3482 Encounter for supervision of other normal pregnancy, second trimester: Secondary | ICD-10-CM | POA: Diagnosis not present

## 2019-05-19 DIAGNOSIS — Z3A28 28 weeks gestation of pregnancy: Secondary | ICD-10-CM | POA: Diagnosis not present

## 2019-05-19 DIAGNOSIS — Z3689 Encounter for other specified antenatal screening: Secondary | ICD-10-CM | POA: Diagnosis not present

## 2019-05-19 DIAGNOSIS — D573 Sickle-cell trait: Secondary | ICD-10-CM | POA: Diagnosis not present

## 2019-05-19 DIAGNOSIS — O99113 Other diseases of the blood and blood-forming organs and certain disorders involving the immune mechanism complicating pregnancy, third trimester: Secondary | ICD-10-CM | POA: Diagnosis not present

## 2019-05-19 DIAGNOSIS — Z3A32 32 weeks gestation of pregnancy: Secondary | ICD-10-CM | POA: Diagnosis not present

## 2019-05-19 DIAGNOSIS — Z3480 Encounter for supervision of other normal pregnancy, unspecified trimester: Secondary | ICD-10-CM | POA: Diagnosis not present

## 2019-05-19 DIAGNOSIS — Z3A34 34 weeks gestation of pregnancy: Secondary | ICD-10-CM | POA: Diagnosis not present

## 2019-05-19 DIAGNOSIS — Z3A30 30 weeks gestation of pregnancy: Secondary | ICD-10-CM | POA: Diagnosis not present

## 2019-06-02 DIAGNOSIS — O09523 Supervision of elderly multigravida, third trimester: Secondary | ICD-10-CM | POA: Diagnosis not present

## 2019-06-02 DIAGNOSIS — Z23 Encounter for immunization: Secondary | ICD-10-CM | POA: Diagnosis not present

## 2019-06-02 DIAGNOSIS — Z3A30 30 weeks gestation of pregnancy: Secondary | ICD-10-CM | POA: Diagnosis not present

## 2019-06-16 DIAGNOSIS — Z3A32 32 weeks gestation of pregnancy: Secondary | ICD-10-CM | POA: Diagnosis not present

## 2019-06-16 DIAGNOSIS — O99113 Other diseases of the blood and blood-forming organs and certain disorders involving the immune mechanism complicating pregnancy, third trimester: Secondary | ICD-10-CM | POA: Diagnosis not present

## 2019-06-22 ENCOUNTER — Telehealth: Payer: Self-pay | Admitting: Hematology and Oncology

## 2019-06-22 NOTE — Telephone Encounter (Signed)
Received a new pt referral from [facility] for thrombocytopenia in pregnancy. Melissa Li has been cld and scheduled to see Dr. Leonides Schanz on 2/19 at 1pm. Pt aware to arrive 15 minutes early.

## 2019-06-26 ENCOUNTER — Encounter: Payer: Self-pay | Admitting: Hematology and Oncology

## 2019-06-26 ENCOUNTER — Inpatient Hospital Stay: Payer: BC Managed Care – PPO | Attending: Hematology and Oncology | Admitting: Hematology and Oncology

## 2019-06-26 ENCOUNTER — Other Ambulatory Visit: Payer: Self-pay

## 2019-06-26 ENCOUNTER — Inpatient Hospital Stay: Payer: BC Managed Care – PPO

## 2019-06-26 VITALS — BP 120/57 | HR 79 | Temp 98.3°F | Resp 17 | Ht 63.0 in | Wt 246.2 lb

## 2019-06-26 DIAGNOSIS — Z862 Personal history of diseases of the blood and blood-forming organs and certain disorders involving the immune mechanism: Secondary | ICD-10-CM | POA: Insufficient documentation

## 2019-06-26 DIAGNOSIS — D696 Thrombocytopenia, unspecified: Secondary | ICD-10-CM | POA: Insufficient documentation

## 2019-06-26 DIAGNOSIS — O99119 Other diseases of the blood and blood-forming organs and certain disorders involving the immune mechanism complicating pregnancy, unspecified trimester: Secondary | ICD-10-CM

## 2019-06-26 LAB — CBC WITH DIFFERENTIAL (CANCER CENTER ONLY)
Abs Immature Granulocytes: 0.04 10*3/uL (ref 0.00–0.07)
Basophils Absolute: 0 10*3/uL (ref 0.0–0.1)
Basophils Relative: 0 %
Eosinophils Absolute: 0.1 10*3/uL (ref 0.0–0.5)
Eosinophils Relative: 1 %
HCT: 35.1 % — ABNORMAL LOW (ref 36.0–46.0)
Hemoglobin: 11.7 g/dL — ABNORMAL LOW (ref 12.0–15.0)
Immature Granulocytes: 1 %
Lymphocytes Relative: 17 %
Lymphs Abs: 1.3 10*3/uL (ref 0.7–4.0)
MCH: 26.4 pg (ref 26.0–34.0)
MCHC: 33.3 g/dL (ref 30.0–36.0)
MCV: 79.2 fL — ABNORMAL LOW (ref 80.0–100.0)
Monocytes Absolute: 0.6 10*3/uL (ref 0.1–1.0)
Monocytes Relative: 8 %
Neutro Abs: 5.9 10*3/uL (ref 1.7–7.7)
Neutrophils Relative %: 73 %
Platelet Count: 97 10*3/uL — ABNORMAL LOW (ref 150–400)
RBC: 4.43 MIL/uL (ref 3.87–5.11)
RDW: 14.5 % (ref 11.5–15.5)
WBC Count: 8 10*3/uL (ref 4.0–10.5)
nRBC: 0 % (ref 0.0–0.2)

## 2019-06-26 LAB — LACTATE DEHYDROGENASE: LDH: 142 U/L (ref 98–192)

## 2019-06-26 LAB — CMP (CANCER CENTER ONLY)
ALT: 10 U/L (ref 0–44)
AST: 13 U/L — ABNORMAL LOW (ref 15–41)
Albumin: 3 g/dL — ABNORMAL LOW (ref 3.5–5.0)
Alkaline Phosphatase: 72 U/L (ref 38–126)
Anion gap: 8 (ref 5–15)
BUN: 5 mg/dL — ABNORMAL LOW (ref 6–20)
CO2: 21 mmol/L — ABNORMAL LOW (ref 22–32)
Calcium: 8.7 mg/dL — ABNORMAL LOW (ref 8.9–10.3)
Chloride: 109 mmol/L (ref 98–111)
Creatinine: 0.77 mg/dL (ref 0.44–1.00)
GFR, Est AFR Am: >60 mL/min (ref >60)
GFR, Estimated: >60 mL/min (ref >60)
Glucose, Bld: 102 mg/dL — ABNORMAL HIGH (ref 70–99)
Potassium: 4 mmol/L (ref 3.5–5.1)
Sodium: 138 mmol/L (ref 135–145)
Total Bilirubin: <0.2 mg/dL — ABNORMAL LOW (ref 0.3–1.2)
Total Protein: 6.7 g/dL (ref 6.5–8.1)

## 2019-06-26 LAB — PLATELET FUNCTION ASSAY: Collagen / Epinephrine: 128 seconds (ref 0–193)

## 2019-06-26 LAB — IRON AND TIBC
Iron: 62 ug/dL (ref 41–142)
Saturation Ratios: 16 % — ABNORMAL LOW (ref 21–57)
TIBC: 401 ug/dL (ref 236–444)
UIBC: 339 ug/dL (ref 120–384)

## 2019-06-26 LAB — VITAMIN B12: Vitamin B-12: 170 pg/mL — ABNORMAL LOW (ref 180–914)

## 2019-06-26 LAB — FERRITIN: Ferritin: 15 ng/mL (ref 11–307)

## 2019-06-26 LAB — SAVE SMEAR(SSMR), FOR PROVIDER SLIDE REVIEW

## 2019-06-26 LAB — FOLATE: Folate: 29.5 ng/mL (ref >5.9)

## 2019-06-26 LAB — PLATELET BY CITRATE

## 2019-06-26 LAB — IMMATURE PLATELET FRACTION: Immature Platelet Fraction: 18.7 % — ABNORMAL HIGH (ref 1.2–8.6)

## 2019-06-26 LAB — PROTIME-INR
INR: 1 (ref 0.8–1.2)
Prothrombin Time: 12.9 seconds (ref 11.4–15.2)

## 2019-06-26 LAB — APTT: aPTT: 29 seconds (ref 24–36)

## 2019-06-26 NOTE — Progress Notes (Signed)
[facility] [phone]   [phone]  INITIAL CONSULT NOTE  Patient Care Team: Maxie Better, MD as PCP - General (Obstetrics and Gynecology)  Hematological/Oncological History #Thrombocytopenia in Pregnancy  1) 04/30/2009: WBC 10.5, Hgb 12.3, MCV 82, Plt 90 (noted to be large). Birth of patient's first daughter 2) 01/26/2013: Establish care with Dr. Cyndie Chime of the [facility].  WBC 8.0, Hgb 11.5, Plt 105, MCV 76.6. Measured during pregnancy with 2nd daughter. Thought to be benign thrombocytopenia of pregnancy.  3) 03/16/2013: return to Dr. Cyndie Chime with interval weekly labs. Plts 98-108 4)  10/27/2013: WBC 5.2, Plt 125, Hgb 12.1. Dr. Cyndie Chime notes she has a chronic mild thrombocytopenia now unrelated to pregnancy. Recommended yearly f/u. 5) 10/27/2013-06/26/2019: lost to follow up. 6) 06/26/2019: establish care with Dr. Leonides Schanz at [facility].   CHIEF COMPLAINTS/PURPOSE OF CONSULTATION:  "Thrombocytopenia in Pregnancy"  HISTORY OF PRESENTING ILLNESS:  Melissa Li 34 y.o. female with medical history significant for thrombocytopenia in pregnancy who presents to establish care with Dr. Leonides Schanz.   On review of the previous records Mrs. Blanda has had thrombocytopenia since at least 04/30/2009.  On her earliest labs on record she was noted to have white blood cell count 10.5, hemoglobin 12.3, platelet count of 90.  Of note the platelets were reported to be large at that time.  This was the date the birth of the patient's first daughter.  During the patient second pregnancy she was noted to have a decrease in her platelet count again.  She was referred to Dr. Cyndie Chime here at the [facility] for further evaluation and management.  On 01/26/2013 establish care with her and performed a CBC which showed a white blood cell count 8.0 hemoglobin 11.5 and platelets of 105 and MCV of 76.6.  At that time Dr. Cyndie Chime thought this may be benign  thrombocytopenia of pregnancy, but as a precaution followed her labs weekly for the duration of her pregnancy.  She had platelets ranging from 98-108,000 during that time.    On 10/27/2013 when the patient was not pregnant she was found to have a platelet count of 125.  At that point time Dr. Cyndie Chime believe that she had a chronic mild thrombocytopenia longer related pregnancy and recommended yearly observation.  After that point the patient was lost to follow-up.  She subsequently became pregnant again and was found to have thrombocytopenia again.  Due to concern for this thrombocytopenia during pregnancy the patient was referred to hematology for further evaluation and management.  On exam today Ms. Niebla notes that she first learned that she had thrombocytopenia during her first pregnancy in 2010.  She notes that throughout the duration of her life she has never had any issues with bleeding, bruising, or dark stools. She denies excessive bleeding with injury or petechiae.  She does endorse that before the birth of her first daughter she had particularly heavy menstrual cycles, but after becoming pregnant these spontaneously resolved.  She notes that she has no family history of bleeding disorders and has undergone the birth of 2 children and epidural placement without any bleeding complications.  She notes that the only symptom that she is experiencing today is increased fatigue thought to be secondary to pregnancy.  She notes that prior to becoming pregnant she was drinking approximately 1 glass of wine per week and denied having any history of smoking.  On further discussion she had no other questions or concerns today.  A full 10 point ROS  was otherwise negative.  MEDICAL HISTORY:  Past Medical History:  Diagnosis Date  . PCOS (polycystic ovarian syndrome)   . Postpartum care following vaginal delivery (03/30/13) 03/31/2013  . Pregnancy, supervision of, high-risk 01/14/2013  . SVD (spontaneous  vaginal delivery) 03/31/2013  . Thrombocytopenia complicating pregnancy ([place]) 2010    SURGICAL HISTORY: History reviewed. No pertinent surgical history.  SOCIAL HISTORY: Social History   Socioeconomic History  . Marital status: Married    Spouse name: Not on file  . Number of children: Not on file  . Years of education: Not on file  . Highest education level: Not on file  Occupational History  . Not on file  Tobacco Use  . Smoking status: Never Smoker  Substance and Sexual Activity  . Alcohol use: Not Currently    Comment: 1 glass of wine per week prior to pregnancy  . Drug use: No  . Sexual activity: Yes    Birth control/protection: None  Other Topics Concern  . Not on file  Social History Narrative  . Not on file   Social Determinants of Health   Financial Resource Strain:   . Difficulty of Paying Living Expenses: Not on file  Food Insecurity:   . Worried About Charity fundraiser in the Last Year: Not on file  . Ran Out of Food in the Last Year: Not on file  Transportation Needs:   . Lack of Transportation (Medical): Not on file  . Lack of Transportation (Non-Medical): Not on file  Physical Activity:   . Days of Exercise per Week: Not on file  . Minutes of Exercise per Session: Not on file  Stress:   . Feeling of Stress : Not on file  Social Connections:   . Frequency of Communication with Friends and Family: Not on file  . Frequency of Social Gatherings with Friends and Family: Not on file  . Attends Religious Services: Not on file  . Active Member of Clubs or Organizations: Not on file  . Attends Archivist Meetings: Not on file  . Marital Status: Not on file  Intimate Partner Violence:   . Fear of Current or Ex-Partner: Not on file  . Emotionally Abused: Not on file  . Physically Abused: Not on file  . Sexually Abused: Not on file    FAMILY HISTORY: Family History  Problem Relation Age of Onset  . Hypertension Mother   . Diabetes Mother    . Diabetes Paternal Aunt     ALLERGIES:  has No Known Allergies.  MEDICATIONS:  Current Outpatient Medications  Medication Sig Dispense Refill  . Acetaminophen 500 MG coapsule Take 500 mg by mouth as needed.    . calcium carbonate (TUMS) 500 MG chewable tablet Chew 500 mg by mouth as needed.    . Prenat-FeFmCb-DSS-FA-DHA w/o A (CITRANATAL HARMONY) 27-1-260 MG CAPS Take 260 mg by mouth daily.     No current facility-administered medications for this visit.    REVIEW OF SYSTEMS:   Constitutional: ( - ) fevers, ( - )  chills , ( - ) night sweats (+) fatigue Eyes: ( - ) blurriness of vision, ( - ) double vision, ( - ) watery eyes Ears, nose, mouth, throat, and face: ( - ) mucositis, ( - ) sore throat Respiratory: ( - ) cough, ( - ) dyspnea, ( - ) wheezes Cardiovascular: ( - ) palpitation, ( - ) chest discomfort, ( - ) lower extremity swelling Gastrointestinal:  ( - ) nausea, ( - )  heartburn, ( - ) change in bowel habits Skin: ( - ) abnormal skin rashes Lymphatics: ( - ) new lymphadenopathy, ( - ) easy bruising Neurological: ( - ) numbness, ( - ) tingling, ( - ) new weaknesses Behavioral/Psych: ( - ) mood change, ( - ) new changes  All other systems were reviewed with the patient and are negative.  PHYSICAL EXAMINATION: ECOG PERFORMANCE STATUS: 0 - Asymptomatic  Vitals:   06/26/19 1250  BP: (!) 120/57  Pulse: 79  Resp: 17  Temp: 98.3 F (36.8 C)  SpO2: 100%   Filed Weights   06/26/19 1250  Weight: 246 lb 3.2 oz (111.7 kg)    GENERAL: well appearing pregnant young African American female in NAD  SKIN: skin color, texture, turgor are normal, no rashes or significant lesions EYES: conjunctiva are pink and non-injected, sclera clear LUNGS: clear to auscultation and percussion with normal breathing effort HEART: regular rate & rhythm and no murmurs and no lower extremity edema ABDOMEN: gravid uterus. Unable to appreciate HSM.  Musculoskeletal: no cyanosis of digits and no  clubbing  PSYCH: alert & oriented x 3, fluent speech NEURO: no focal motor/sensory deficits  LABORATORY DATA:  I have reviewed the data as listed CBC Latest Ref Rng & Units 06/26/2019 06/23/2016 10/27/2013  WBC 4.0 - 10.5 K/uL 8.0 6.1 5.2  Hemoglobin 12.0 - 15.0 g/dL 11.7(L) 12.0 12.1  Hematocrit 36.0 - 46.0 % 35.1(L) 35.2(L) 35.5(L)  Platelets 150 - 400 K/uL 97(L) 134(L) 125(L)    CMP Latest Ref Rng & Units 06/26/2019 06/23/2016 01/26/2013  Glucose 70 - 99 mg/dL 937(J) 696(V) 82  BUN 6 - 20 mg/dL 5(L) 9 5.0(L)  Creatinine 0.44 - 1.00 mg/dL 8.93 8.10 0.7  Sodium 175 - 145 mmol/L 138 139 138  Potassium 3.5 - 5.1 mmol/L 4.0 3.3(L) 4.2  Chloride 98 - 111 mmol/L 109 106 -  CO2 22 - 32 mmol/L 21(L) 27 22  Calcium 8.9 - 10.3 mg/dL 1.0(C) 9.1 8.9  Total Protein 6.5 - 8.1 g/dL 6.7 - 6.8  Total Bilirubin 0.3 - 1.2 mg/dL <5.8(N) - 2.77  Alkaline Phos 38 - 126 U/L 72 - 48  AST 15 - 41 U/L 13(L) - 16  ALT 0 - 44 U/L 10 - 15   PATHOLOGY: None relevant to review.   BLOOD FILM: Ordered, but unable to review at time of note writing. Findings will be placed in an addendum or in a separate note.   RADIOGRAPHIC STUDIES: None relevant to review.  No results found.  ASSESSMENT & PLAN GENEVRA SABET 34 y.o. female with medical history significant for thrombocytopenia in pregnancy who presents to establish care with Dr. Leonides Schanz.  Ms. Sim has a longstanding history of thrombocytopenia dating back to at least 2010.  The patient did establish care with Dr. Cyndie Chime in 2014 who was never able to determine a clear etiology for her thrombocytopenia.  Initially was thought to be secondary to gestational thrombocytopenia, however when it persisted after pregnancy was thought to be some other etiology.  Of note the patient has had consistently low platelets, though never low enough to require platelet transfusion or to prevent her from safely delivering.    Given these findings I think the patient may have a  chronic idiopathic thrombocytopenia, but as long as she does not have any issues with bleeding, bruising or drops below the threshold for surgery I do not believe any further intervention is required.  I would recommend the patient continue to  follow in our clinic and we will certainly see her prior to her next expected delivery in order to assure that she has platelet levels that are adequate for delivery.  We will continue to monitor her at least yearly in the outpatient setting and we do request that once she become pregnant again that she be promptly referred to our clinic.  #Chronic Thrombocytopenia of Unclear Etiology --today will order CBC, citrated platelets, CMP, and peripheral blood film --additionally will collect PFA, PT/INR, and PTT to determine if there is any abnormality with coagulation or platelet function given reports of large platelets --HIV and Hep B previously negative in 2014. Splenic imaging normal in 2014. --will order nutritional studies with iron panel, ferritin, and Vitamin B12/folate/MMA/and homocysteine --patient possibly has a low grade chronic ITP. As long as her platelet counts is >50K she can safely deliver and >70K she can safely receive an epidural ( Anesthesiology. 2017 Jun;126(6):1053-1063).  --recommend transfusion goals of >50K if platelets continue to drop.  --we will have patient return to clinic in late March prior to delivery to determine if any other interventions are required prior to delivery.   Orders Placed This Encounter  Procedures  . CBC with Differential (Cancer Center Only)    Standing Status:   Future    Number of Occurrences:   1    Standing Expiration Date:   06/25/2020  . Immature Platelet Fraction    Standing Status:   Future    Number of Occurrences:   1    Standing Expiration Date:   06/25/2020  . Platelet by Citrate    Standing Status:   Future    Number of Occurrences:   1    Standing Expiration Date:   06/25/2020  . Save Smear  (SSMR)    Standing Status:   Future    Number of Occurrences:   1    Standing Expiration Date:   06/25/2020  . CMP (Cancer Center only)    Standing Status:   Future    Number of Occurrences:   1    Standing Expiration Date:   06/25/2020  . Lactate dehydrogenase (LDH)    Standing Status:   Future    Number of Occurrences:   1    Standing Expiration Date:   06/25/2020  . Iron and TIBC    Standing Status:   Future    Number of Occurrences:   1    Standing Expiration Date:   06/25/2020  . Ferritin    Standing Status:   Future    Number of Occurrences:   1    Standing Expiration Date:   06/25/2020  . Vitamin B12    Standing Status:   Future    Number of Occurrences:   1    Standing Expiration Date:   06/25/2020  . Folate, Serum    Standing Status:   Future    Number of Occurrences:   1    Standing Expiration Date:   06/25/2020  . Methylmalonic acid, serum    Standing Status:   Future    Number of Occurrences:   1    Standing Expiration Date:   06/25/2020  . Homocysteine, serum    Standing Status:   Future    Number of Occurrences:   1    Standing Expiration Date:   06/25/2020  . Protime-INR    Standing Status:   Future    Number of Occurrences:   1    Standing  Expiration Date:   06/25/2020  . APTT    Standing Status:   Future    Number of Occurrences:   1    Standing Expiration Date:   06/25/2020  . Platelet function assay    Standing Status:   Future    Number of Occurrences:   1    Standing Expiration Date:   06/25/2020    All questions were answered. The patient knows to call the clinic with any problems, questions or concerns.  A total of more than 60 minutes were spent on this encounter and over half of that time was spent on counseling and coordination of care as outlined above.   Ulysees Barns, MD Department of Hematology/Oncology [facility] Phone: [phone] Pager: [phone] Email: [email or web address]  06/26/2019  7:52 PM   Literature Support:  Primitivo Gauze BT, [place], Klumpner TT, Lydia, [place] MF, Hand KW, Elaina Pattee, Goodier CG, Ethel Rana ME; [employer] Investigators. Risk of Epidural Hematoma after Neuraxial Techniques in Thrombocytopenic Parturients: A Report from the [employer]. Anesthesiology. 2017 Jun;126(6):1053-1063. doi: 10.1097/ALN.0000000000001630. PMID: 31497026; PMCID: [id number].  --The upper bound of the 95% CI for the risk of epidural hematoma for a platelet count of 0 to 49,000 mm is 11%, for 50,000 to 69,000 mm is 3%, and for 70,000 to 100,000 mm is 0.2%.

## 2019-06-27 ENCOUNTER — Encounter: Payer: Self-pay | Admitting: Hematology and Oncology

## 2019-06-27 LAB — HOMOCYSTEINE: Homocysteine: 5.9 umol/L (ref 0.0–14.5)

## 2019-06-29 ENCOUNTER — Telehealth: Payer: Self-pay | Admitting: Hematology and Oncology

## 2019-06-29 NOTE — Telephone Encounter (Signed)
Scheduled per los. Called and spoke with patient. Confirmed appt 

## 2019-07-01 LAB — METHYLMALONIC ACID, SERUM: Methylmalonic Acid, Quantitative: 183 nmol/L (ref 0–378)

## 2019-07-02 ENCOUNTER — Telehealth: Payer: Self-pay

## 2019-07-02 ENCOUNTER — Other Ambulatory Visit: Payer: Self-pay | Admitting: Hematology and Oncology

## 2019-07-02 ENCOUNTER — Telehealth: Payer: Self-pay | Admitting: Hematology and Oncology

## 2019-07-02 MED ORDER — VITAMIN B-12 1000 MCG PO TABS: 1000.0000 ug | ORAL_TABLET | Freq: Every day | ORAL | 3 refills | Status: AC

## 2019-07-02 NOTE — Telephone Encounter (Signed)
TCT patient regarding her lab results. Informed her that a mildly low Vt. B12 was noted and she will need Vt. B12 pills. Patient confirmed Advertising copywriter for medication delivery.  Concerning March appointment, patient requested to see Dr.Dorsey the week of 07/24/19 because her OB-GYN plans to induce her for delivery before her EDD in April. Dr. Leonides Schanz was agreeable with this.  Scheduling message sent for Lab and MD visit on 07/22/19.

## 2019-07-02 NOTE — Telephone Encounter (Signed)
Scheduled appt per 2/25 sch message - pt is aware of new appt date and time

## 2019-07-02 NOTE — Telephone Encounter (Signed)
-----   Message from Kyra Searles, RN sent at 07/02/2019  1:45 PM EST -----  ----- Message ----- From: Jaci Standard, MD Sent: 07/02/2019  11:59 AM EST To: Kyra Searles, RN  Please call Melissa Li to let her know her labs have returned. The only abnormality noted is that she has a mildly low Vitamin b12 level and will need vitamin b12 pills called into her local pharmacy. Please ask where she would like these delivered. We will see her back in March prior to the delivery.  [name]  ----- Message ----- From: Leory Plowman, Lab In [place] Sent: 06/26/2019   2:37 PM EST To: Jaci Standard, MD

## 2019-07-13 DIAGNOSIS — O99113 Other diseases of the blood and blood-forming organs and certain disorders involving the immune mechanism complicating pregnancy, third trimester: Secondary | ICD-10-CM | POA: Diagnosis not present

## 2019-07-13 DIAGNOSIS — Z3A36 36 weeks gestation of pregnancy: Secondary | ICD-10-CM | POA: Diagnosis not present

## 2019-07-13 DIAGNOSIS — Z3685 Encounter for antenatal screening for Streptococcus B: Secondary | ICD-10-CM | POA: Diagnosis not present

## 2019-07-13 DIAGNOSIS — O09523 Supervision of elderly multigravida, third trimester: Secondary | ICD-10-CM | POA: Diagnosis not present

## 2019-07-13 LAB — OB RESULTS CONSOLE GBS
GBS: NEGATIVE
GBS: POSITIVE
GBS: POSITIVE

## 2019-07-21 ENCOUNTER — Other Ambulatory Visit: Payer: Self-pay

## 2019-07-21 DIAGNOSIS — Z3A37 37 weeks gestation of pregnancy: Secondary | ICD-10-CM | POA: Diagnosis not present

## 2019-07-21 DIAGNOSIS — D696 Thrombocytopenia, unspecified: Secondary | ICD-10-CM

## 2019-07-21 DIAGNOSIS — O99113 Other diseases of the blood and blood-forming organs and certain disorders involving the immune mechanism complicating pregnancy, third trimester: Secondary | ICD-10-CM | POA: Diagnosis not present

## 2019-07-22 ENCOUNTER — Encounter: Payer: Self-pay | Admitting: Hematology and Oncology

## 2019-07-22 ENCOUNTER — Other Ambulatory Visit: Payer: Self-pay

## 2019-07-22 ENCOUNTER — Inpatient Hospital Stay: Payer: BC Managed Care – PPO | Admitting: Hematology and Oncology

## 2019-07-22 ENCOUNTER — Inpatient Hospital Stay: Payer: BC Managed Care – PPO | Attending: Hematology and Oncology

## 2019-07-22 VITALS — BP 124/52 | HR 84 | Temp 98.0°F | Resp 18 | Ht 63.0 in | Wt 246.9 lb

## 2019-07-22 DIAGNOSIS — D6959 Other secondary thrombocytopenia: Secondary | ICD-10-CM | POA: Diagnosis not present

## 2019-07-22 DIAGNOSIS — D696 Thrombocytopenia, unspecified: Secondary | ICD-10-CM

## 2019-07-22 DIAGNOSIS — O99113 Other diseases of the blood and blood-forming organs and certain disorders involving the immune mechanism complicating pregnancy, third trimester: Secondary | ICD-10-CM | POA: Insufficient documentation

## 2019-07-22 LAB — CBC WITH DIFFERENTIAL (CANCER CENTER ONLY)
Abs Immature Granulocytes: 0.03 10*3/uL (ref 0.00–0.07)
Basophils Absolute: 0 10*3/uL (ref 0.0–0.1)
Basophils Relative: 0 %
Eosinophils Absolute: 0 10*3/uL (ref 0.0–0.5)
Eosinophils Relative: 0 %
HCT: 36.3 % (ref 36.0–46.0)
Hemoglobin: 12 g/dL (ref 12.0–15.0)
Immature Granulocytes: 0 %
Lymphocytes Relative: 18 %
Lymphs Abs: 1.5 10*3/uL (ref 0.7–4.0)
MCH: 26 pg (ref 26.0–34.0)
MCHC: 33.1 g/dL (ref 30.0–36.0)
MCV: 78.7 fL — ABNORMAL LOW (ref 80.0–100.0)
Monocytes Absolute: 0.9 10*3/uL (ref 0.1–1.0)
Monocytes Relative: 11 %
Neutro Abs: 5.8 10*3/uL (ref 1.7–7.7)
Neutrophils Relative %: 71 %
Platelet Count: 90 10*3/uL — ABNORMAL LOW (ref 150–400)
RBC: 4.61 MIL/uL (ref 3.87–5.11)
RDW: 14.7 % (ref 11.5–15.5)
WBC Count: 8.4 10*3/uL (ref 4.0–10.5)
nRBC: 0 % (ref 0.0–0.2)

## 2019-07-22 LAB — CMP (CANCER CENTER ONLY)
ALT: 13 U/L (ref 0–44)
AST: 15 U/L (ref 15–41)
Albumin: 2.9 g/dL — ABNORMAL LOW (ref 3.5–5.0)
Alkaline Phosphatase: 88 U/L (ref 38–126)
Anion gap: 5 (ref 5–15)
BUN: 6 mg/dL (ref 6–20)
CO2: 24 mmol/L (ref 22–32)
Calcium: 8.6 mg/dL — ABNORMAL LOW (ref 8.9–10.3)
Chloride: 106 mmol/L (ref 98–111)
Creatinine: 0.75 mg/dL (ref 0.44–1.00)
GFR, Est AFR Am: >60 mL/min (ref >60)
GFR, Estimated: >60 mL/min (ref >60)
Glucose, Bld: 76 mg/dL (ref 70–99)
Potassium: 3.9 mmol/L (ref 3.5–5.1)
Sodium: 135 mmol/L (ref 135–145)
Total Bilirubin: 0.4 mg/dL (ref 0.3–1.2)
Total Protein: 6.5 g/dL (ref 6.5–8.1)

## 2019-07-22 NOTE — Progress Notes (Signed)
[place] [phone]   [phone]  PROGRESS NOTE  Patient Care Team: Servando Salina, MD as PCP - General (Obstetrics and Gynecology)  Hematological/Oncological History #Thrombocytopenia in Pregnancy  1) 04/30/2009: WBC 10.5, Hgb 12.3, MCV 82, Plt 90 (noted to be large). Birth of patient's first daughter 2) 01/26/2013: Establish care with Dr. Beryle Beams of the [facility].  WBC 8.0, Hgb 11.5, Plt 105, MCV 76.6. Measured during pregnancy with 2nd daughter. Thought to be benign thrombocytopenia of pregnancy.  3) 03/16/2013: return to Dr. Beryle Beams with interval weekly labs. Plts 98-108 4)  10/27/2013: WBC 5.2, Plt 125, Hgb 12.1. Dr. Beryle Beams notes she has a chronic mild thrombocytopenia now unrelated to pregnancy. Recommended yearly f/u. 5) 10/27/2013-06/26/2019: lost to follow up. 6) 06/26/2019: establish care with Dr. Lorenso Courier at [facility].  7) 07/22/2019: WBC 8.4, Hgb 12.0, Plt 90, MCV 78.7.  Interval History:  Melissa Li 34 y.o. female with medical history significant for gestational thrombocytopenia who presents for a follow up visit. The patient's last visit was on 06/26/2019 at which time she established care. In the interim since the last visit she has had no bleeding/bruising or other issues.  On exam today Melissa Li notes that she has been well over the last month.  She reports that she has not had any trouble with bleeding, bruising, or dark stools.  She reports that she is eager to hear what her platelet count is as she would like to receive an epidural prior to delivery.  She is currently on schedule to deliver on 08/05/2019.  She notes that her appetite is good and she has not been having any issues with lightheadedness, dizziness, or headaches.  A full 10 point ROS is listed below.  MEDICAL HISTORY:  Past Medical History:  Diagnosis Date  . PCOS (polycystic ovarian syndrome)   . Postpartum care following vaginal delivery (03/30/13) 03/31/2013   . Pregnancy, supervision of, high-risk 01/14/2013  . SVD (spontaneous vaginal delivery) 03/31/2013  . Thrombocytopenia complicating pregnancy ([place]) 2010    SURGICAL HISTORY: No past surgical history on file.  SOCIAL HISTORY: Social History   Socioeconomic History  . Marital status: Married    Spouse name: Not on file  . Number of children: Not on file  . Years of education: Not on file  . Highest education level: Not on file  Occupational History  . Not on file  Tobacco Use  . Smoking status: Never Smoker  Substance and Sexual Activity  . Alcohol use: Not Currently    Comment: 1 glass of wine per week prior to pregnancy  . Drug use: No  . Sexual activity: Yes    Birth control/protection: None  Other Topics Concern  . Not on file  Social History Narrative  . Not on file   Social Determinants of Health   Financial Resource Strain:   . Difficulty of Paying Living Expenses:   Food Insecurity:   . Worried About Charity fundraiser in the Last Year:   . Arboriculturist in the Last Year:   Transportation Needs:   . Film/video editor (Medical):   Marland Kitchen Lack of Transportation (Non-Medical):   Physical Activity:   . Days of Exercise per Week:   . Minutes of Exercise per Session:   Stress:   . Feeling of Stress :   Social Connections:   . Frequency of Communication with Friends and Family:   . Frequency of Social Gatherings with Friends and Family:   .  Attends Religious Services:   . Active Member of Clubs or Organizations:   . Attends Banker Meetings:   Marland Kitchen Marital Status:   Intimate Partner Violence:   . Fear of Current or Ex-Partner:   . Emotionally Abused:   Marland Kitchen Physically Abused:   . Sexually Abused:     FAMILY HISTORY: Family History  Problem Relation Age of Onset  . Hypertension Mother   . Diabetes Mother   . Diabetes Paternal Aunt     ALLERGIES:  has No Known Allergies.  MEDICATIONS:  Current Outpatient Medications  Medication Sig  Dispense Refill  . Acetaminophen 500 MG coapsule Take 500 mg by mouth as needed.    . calcium carbonate (TUMS) 500 MG chewable tablet Chew 500 mg by mouth as needed.    . Prenat-FeFmCb-DSS-FA-DHA w/o A (CITRANATAL HARMONY) 27-1-260 MG CAPS Take 260 mg by mouth daily.    . vitamin B-12 (CYANOCOBALAMIN) 1000 MCG tablet Take 1 tablet (1,000 mcg total) by mouth daily. 30 tablet 3   No current facility-administered medications for this visit.    REVIEW OF SYSTEMS:   Constitutional: ( - ) fevers, ( - )  chills , ( - ) night sweats Eyes: ( - ) blurriness of vision, ( - ) double vision, ( - ) watery eyes Ears, nose, mouth, throat, and face: ( - ) mucositis, ( - ) sore throat Respiratory: ( - ) cough, ( - ) dyspnea, ( - ) wheezes Cardiovascular: ( - ) palpitation, ( - ) chest discomfort, ( - ) lower extremity swelling Gastrointestinal:  ( - ) nausea, ( - ) heartburn, ( - ) change in bowel habits Skin: ( - ) abnormal skin rashes Lymphatics: ( - ) new lymphadenopathy, ( - ) easy bruising Neurological: ( - ) numbness, ( - ) tingling, ( - ) new weaknesses Behavioral/Psych: ( - ) mood change, ( - ) new changes  All other systems were reviewed with the patient and are negative.  PHYSICAL EXAMINATION: ECOG PERFORMANCE STATUS: 0 - Asymptomatic  Vitals:   07/22/19 1109  BP: (!) 124/52  Pulse: 84  Resp: 18  Temp: 98 F (36.7 C)  SpO2: 100%   Filed Weights   07/22/19 1109  Weight: 246 lb 14.4 oz (112 kg)    GENERAL: well appearing young [place] American female. Alert, no distress and comfortable SKIN: skin color, texture, turgor are normal, no rashes or significant lesions EYES: conjunctiva are pink and non-injected, sclera clear LUNGS: clear to auscultation and percussion with normal breathing effort HEART: regular rate & rhythm and no murmurs and no lower extremity edema ABDOMEN: gravid uterus.  Musculoskeletal: no cyanosis of digits and no clubbing  PSYCH: alert & oriented x 3, fluent  speech NEURO: no focal motor/sensory deficits  LABORATORY DATA:  I have reviewed the data as listed CBC Latest Ref Rng & Units 07/22/2019 06/26/2019 06/23/2016  WBC 4.0 - 10.5 K/uL 8.4 8.0 6.1  Hemoglobin 12.0 - 15.0 g/dL 01.7 11.7(L) 12.0  Hematocrit 36.0 - 46.0 % 36.3 35.1(L) 35.2(L)  Platelets 150 - 400 K/uL 90(L) 97(L) 134(L)    CMP Latest Ref Rng & Units 07/22/2019 06/26/2019 06/23/2016  Glucose 70 - 99 mg/dL 76 510(C) 585(I)  BUN 6 - 20 mg/dL 6 5(L) 9  Creatinine 7.78 - 1.00 mg/dL 2.42 3.53 6.14  Sodium 135 - 145 mmol/L 135 138 139  Potassium 3.5 - 5.1 mmol/L 3.9 4.0 3.3(L)  Chloride 98 - 111 mmol/L 106 109 106  CO2  22 - 32 mmol/L 24 21(L) 27  Calcium 8.9 - 10.3 mg/dL 1.2(W) 5.8(K) 9.1  Total Protein 6.5 - 8.1 g/dL 6.5 6.7 -  Total Bilirubin 0.3 - 1.2 mg/dL 0.4 <9.9(I) -  Alkaline Phos 38 - 126 U/L 88 72 -  AST 15 - 41 U/L 15 13(L) -  ALT 0 - 44 U/L 13 10 -   RADIOGRAPHIC STUDIES: None relevant to review.  No results found.  ASSESSMENT & PLAN Melissa Li 34 y.o. female with medical history significant for gestational thrombocytopenia who presents for a follow up visit.  During the patient's last visit she had an extensive work-up with thrombocytopenia which did not reveal a clear etiology. There was no evidence of nutritional deficiencies, pseudothrombocytopenia, or liver dysfunction.  At this time most likely source is chronic ITP coupled with gestational thrombocytopenia.  Fortunately at this time the patient's platelet count is greater than 70 K and therefore no further intervention is required at this time.  The patient is expected deliver in approximately 2 weeks and we do recommend collecting a CBC prior to delivery.  In the event that any intervention is required please do contact our service and we are more than happy to assist.  We will plan to have the patient return to our clinic in approximately 6 to 8 weeks following delivery in order to assure that her platelets  have rebounded post pregnancy.  We will continue to have the patient follow in our clinic routinely for monitoring of her platelet count.  #Chronic Thrombocytopenia of Unclear Etiology #Gestational Thrombocytopenia  --patient possibly has a low grade chronic ITP. As long as her platelet counts is >50K she can safely deliver and >70K she can safely receive an epidural ( Anesthesiology. 2017 Jun;126(6):1053-1063).  --recommend transfusion goals of >50K if platelets drop.  --please do contact our service in the event the patient has a drop in platelets on day of delivery 08/05/2019 --RTC 6-8 weeks following delivery to assure rebound in Plt counts.   #Iron Deficiency Without Anemia --patient has a mild microcytosis with no anemia --labs on 06/26/2019 show Sat 16%, ferritin 15. --recommend re-evaluating post delivery, no indication for additional iron supplementation at this time.  --continue to monitor  No orders of the defined types were placed in this encounter.  All questions were answered. The patient knows to call the clinic with any problems, questions or concerns.  A total of more than 20 minutes were spent on this encounter and over half of that time was spent on counseling and coordination of care as outlined above.   Ulysees Barns, MD Department of Hematology/Oncology [facility] Phone: [phone] Pager: [phone] Email: [email or web address]  07/22/2019 4:16 PM

## 2019-07-23 ENCOUNTER — Telehealth: Payer: Self-pay | Admitting: Hematology and Oncology

## 2019-07-23 NOTE — Telephone Encounter (Signed)
Scheduled per los. Called and left msg. Mailed printout  °

## 2019-07-27 ENCOUNTER — Other Ambulatory Visit: Payer: Self-pay | Admitting: Obstetrics and Gynecology

## 2019-07-27 DIAGNOSIS — O99113 Other diseases of the blood and blood-forming organs and certain disorders involving the immune mechanism complicating pregnancy, third trimester: Secondary | ICD-10-CM | POA: Diagnosis not present

## 2019-07-27 DIAGNOSIS — Z3A38 38 weeks gestation of pregnancy: Secondary | ICD-10-CM | POA: Diagnosis not present

## 2019-07-28 ENCOUNTER — Inpatient Hospital Stay (HOSPITAL_COMMUNITY): Payer: BC Managed Care – PPO

## 2019-07-28 ENCOUNTER — Inpatient Hospital Stay (HOSPITAL_COMMUNITY): Payer: BC Managed Care – PPO | Admitting: Anesthesiology

## 2019-07-28 ENCOUNTER — Encounter (HOSPITAL_COMMUNITY): Payer: Self-pay | Admitting: Obstetrics and Gynecology

## 2019-07-28 ENCOUNTER — Inpatient Hospital Stay (HOSPITAL_COMMUNITY)
Admission: AD | Admit: 2019-07-28 | Discharge: 2019-07-31 | DRG: 797 | Disposition: A | Discharge status: Home / Self Care | Payer: BC Managed Care – PPO | Attending: Obstetrics and Gynecology | Admitting: Obstetrics and Gynecology

## 2019-07-28 ENCOUNTER — Other Ambulatory Visit: Payer: Self-pay

## 2019-07-28 DIAGNOSIS — D6959 Other secondary thrombocytopenia: Secondary | ICD-10-CM | POA: Diagnosis present

## 2019-07-28 DIAGNOSIS — O9912 Other diseases of the blood and blood-forming organs and certain disorders involving the immune mechanism complicating childbirth: Secondary | ICD-10-CM | POA: Diagnosis not present

## 2019-07-28 DIAGNOSIS — D62 Acute posthemorrhagic anemia: Secondary | ICD-10-CM | POA: Diagnosis not present

## 2019-07-28 DIAGNOSIS — O99824 Streptococcus B carrier state complicating childbirth: Secondary | ICD-10-CM | POA: Diagnosis present

## 2019-07-28 DIAGNOSIS — Z3490 Encounter for supervision of normal pregnancy, unspecified, unspecified trimester: Secondary | ICD-10-CM | POA: Diagnosis present

## 2019-07-28 DIAGNOSIS — Z833 Family history of diabetes mellitus: Secondary | ICD-10-CM

## 2019-07-28 DIAGNOSIS — Z23 Encounter for immunization: Secondary | ICD-10-CM | POA: Diagnosis not present

## 2019-07-28 DIAGNOSIS — Z3A38 38 weeks gestation of pregnancy: Secondary | ICD-10-CM

## 2019-07-28 DIAGNOSIS — O9081 Anemia of the puerperium: Secondary | ICD-10-CM | POA: Diagnosis not present

## 2019-07-28 DIAGNOSIS — Z20822 Contact with and (suspected) exposure to covid-19: Secondary | ICD-10-CM | POA: Diagnosis present

## 2019-07-28 DIAGNOSIS — O329XX0 Maternal care for malpresentation of fetus, unspecified, not applicable or unspecified: Secondary | ICD-10-CM | POA: Diagnosis not present

## 2019-07-28 DIAGNOSIS — Z302 Encounter for sterilization: Secondary | ICD-10-CM | POA: Diagnosis not present

## 2019-07-28 DIAGNOSIS — D573 Sickle-cell trait: Secondary | ICD-10-CM | POA: Diagnosis not present

## 2019-07-28 LAB — CBC
HCT: 38 % (ref 36.0–46.0)
Hemoglobin: 12.7 g/dL (ref 12.0–15.0)
MCH: 26.4 pg (ref 26.0–34.0)
MCHC: 33.4 g/dL (ref 30.0–36.0)
MCV: 79 fL — ABNORMAL LOW (ref 80.0–100.0)
Platelets: 93 10*3/uL — ABNORMAL LOW (ref 150–400)
RBC: 4.81 MIL/uL (ref 3.87–5.11)
RDW: 14.6 % (ref 11.5–15.5)
WBC: 7.6 10*3/uL (ref 4.0–10.5)
nRBC: 0 % (ref 0.0–0.2)

## 2019-07-28 LAB — TYPE AND SCREEN
ABO/RH(D): O POS
Antibody Screen: NEGATIVE

## 2019-07-28 LAB — RESPIRATORY PANEL BY RT PCR (FLU A&B, COVID)
Influenza A by PCR: NEGATIVE
Influenza B by PCR: NEGATIVE
SARS Coronavirus 2 by RT PCR: NEGATIVE

## 2019-07-28 LAB — ABO/RH: ABO/RH(D): O POS

## 2019-07-28 LAB — RPR: RPR Ser Ql: NONREACTIVE

## 2019-07-28 MED ORDER — EPHEDRINE 5 MG/ML INJ: 10.0000 mg | INTRAVENOUS | Status: DC | PRN

## 2019-07-28 MED ORDER — LACTATED RINGERS IV BOLUS
1000.0000 mL | Freq: Once | INTRAVENOUS | Status: AC
  Administered 2019-07-28: 1000 mL via INTRAVENOUS

## 2019-07-28 MED ORDER — SOD CITRATE-CITRIC ACID 500-334 MG/5ML PO SOLN
30.0000 mL | ORAL | Status: DC | PRN
  Administered 2019-07-28: 30 mL via ORAL
  Filled 2019-07-28: qty 30

## 2019-07-28 MED ORDER — OXYTOCIN BOLUS FROM INFUSION
500.0000 mL | Freq: Once | INTRAVENOUS | Status: AC
  Administered 2019-07-29: 02:00:00 500 mL via INTRAVENOUS

## 2019-07-28 MED ORDER — PHENYLEPHRINE 40 MCG/ML (10ML) SYRINGE FOR IV PUSH (FOR BLOOD PRESSURE SUPPORT)
80.0000 ug | PREFILLED_SYRINGE | INTRAVENOUS | Status: DC | PRN
  Administered 2019-07-28: 80 ug via INTRAVENOUS

## 2019-07-28 MED ORDER — PENICILLIN G POT IN DEXTROSE 60000 UNIT/ML IV SOLN
3.0000 10*6.[IU] | INTRAVENOUS | Status: DC
  Administered 2019-07-28 – 2019-07-29 (×4): 3 10*6.[IU] via INTRAVENOUS
  Filled 2019-07-28 (×4): qty 50

## 2019-07-28 MED ORDER — OXYTOCIN 40 UNITS IN NORMAL SALINE INFUSION - SIMPLE MED: 2.5000 [IU]/h | INTRAVENOUS | Status: DC

## 2019-07-28 MED ORDER — LIDOCAINE HCL (PF) 1 % IJ SOLN
30.0000 mL | INTRAMUSCULAR | Status: DC | PRN
  Filled 2019-07-28: qty 30

## 2019-07-28 MED ORDER — DIPHENHYDRAMINE HCL 50 MG/ML IJ SOLN: 12.5000 mg | INTRAMUSCULAR | Status: DC | PRN

## 2019-07-28 MED ORDER — OXYTOCIN 40 UNITS IN NORMAL SALINE INFUSION - SIMPLE MED
1.0000 m[IU]/min | INTRAVENOUS | Status: DC
  Administered 2019-07-28: 2 m[IU]/min via INTRAVENOUS
  Filled 2019-07-28: qty 1000

## 2019-07-28 MED ORDER — ACETAMINOPHEN 500 MG PO TABS: 1000.0000 mg | ORAL_TABLET | Freq: Once | ORAL | Status: DC

## 2019-07-28 MED ORDER — SODIUM CHLORIDE 0.9 % IV SOLN
5.0000 10*6.[IU] | Freq: Once | INTRAVENOUS | Status: AC
  Administered 2019-07-28: 5 10*6.[IU] via INTRAVENOUS
  Filled 2019-07-28: qty 5

## 2019-07-28 MED ORDER — PHENYLEPHRINE 40 MCG/ML (10ML) SYRINGE FOR IV PUSH (FOR BLOOD PRESSURE SUPPORT)
80.0000 ug | PREFILLED_SYRINGE | INTRAVENOUS | Status: DC | PRN
  Filled 2019-07-28: qty 10

## 2019-07-28 MED ORDER — LACTATED RINGERS IV SOLN: 500.0000 mL | INTRAVENOUS | Status: DC | PRN

## 2019-07-28 MED ORDER — SODIUM CHLORIDE (PF) 0.9 % IJ SOLN
INTRAMUSCULAR | Status: DC | PRN
  Administered 2019-07-28: 12 mL/h via EPIDURAL

## 2019-07-28 MED ORDER — ONDANSETRON HCL 4 MG/2ML IJ SOLN: 4.0000 mg | Freq: Four times a day (QID) | INTRAMUSCULAR | Status: DC | PRN

## 2019-07-28 MED ORDER — ACETAMINOPHEN 325 MG PO TABS
650.0000 mg | ORAL_TABLET | ORAL | Status: DC | PRN
  Administered 2019-07-28: 650 mg via ORAL
  Filled 2019-07-28: qty 2

## 2019-07-28 MED ORDER — LACTATED RINGERS IV SOLN: INTRAVENOUS | Status: DC

## 2019-07-28 MED ORDER — LACTATED RINGERS IV SOLN: 500.0000 mL | Freq: Once | INTRAVENOUS | Status: DC

## 2019-07-28 MED ORDER — FENTANYL-BUPIVACAINE-NACL 0.5-0.125-0.9 MG/250ML-% EP SOLN
12.0000 mL/h | EPIDURAL | Status: DC | PRN
  Filled 2019-07-28: qty 250

## 2019-07-28 MED ORDER — TERBUTALINE SULFATE 1 MG/ML IJ SOLN
0.2500 mg | Freq: Once | INTRAMUSCULAR | Status: AC | PRN
  Administered 2019-07-29: 0.25 mg via SUBCUTANEOUS
  Filled 2019-07-28: qty 1

## 2019-07-28 NOTE — Progress Notes (Signed)
Pt. Admitted for IOL, G3P2 at 38+3 weeks for gestational thrombocytopenia. I was asked by Dr. Cherly Hensen to assess for intracervical balloon placement.  I discussed plan with patient, verbal consent obtained and she agrees with plan. S:  Reports occasional BH ctxs. Concerned for pain after Pitocin is started and inquiring about early epidural   O:  VS: Last menstrual period 11/01/2018, unknown if currently breastfeeding.        FHR : baseline 145 bpm / variability moderate / accelerations + / no decelerations        Toco: occasional contraction/mild        Cervix : Dilation: 1 Effacement (%): 50 Station: -2 Presentation: Vertex Exam by:: Carlean Jews CNM        Membranes: intact Intracervical balloon placed without difficulty and inflated with 60cc LR  Results for orders placed or performed during the hospital encounter of 07/28/19 (from the past 24 hour(s))  CBC     Status: Abnormal   Collection Time: 07/28/19  8:32 AM  Result Value Ref Range   WBC 7.6 4.0 - 10.5 K/uL   RBC 4.81 3.87 - 5.11 MIL/uL   Hemoglobin 12.7 12.0 - 15.0 g/dL   HCT 89.3 81.0 - 17.5 %   MCV 79.0 (L) 80.0 - 100.0 fL   MCH 26.4 26.0 - 34.0 pg   MCHC 33.4 30.0 - 36.0 g/dL   RDW 10.2 58.5 - 27.7 %   Platelets 93 (L) 150 - 400 K/uL   nRBC 0.0 0.0 - 0.2 %  Type and screen     Status: None (Preliminary result)   Collection Time: 07/28/19  8:32 AM  Result Value Ref Range   ABO/RH(D) PENDING    Antibody Screen PENDING    Sample Expiration      07/31/2019,2359 Performed at [facility], [address], [place], [place] [address]   Respiratory Panel by RT PCR (Flu A&B, Covid) - Nasopharyngeal Swab     Status: None   Collection Time: 07/28/19  8:32 AM   Specimen: Nasopharyngeal Swab  Result Value Ref Range   SARS Coronavirus 2 by RT PCR NEGATIVE NEGATIVE   Influenza A by PCR NEGATIVE NEGATIVE   Influenza B by PCR NEGATIVE NEGATIVE    A/P: IOL of labor for gestational thrombocytopenia   - Per Hematology,  cleared for delivery if plts >50K and for epidural if platelets >70K; recommend transfusion goals of >50K if platelets drop   - Plt count today 93, RN to notify anesthesia to discuss if dry cath needed      FHR category 1     GBS negative     Begin Pitocin 2 milliunits x 2 milliunits      Notify provider when pt requesting epidural      Anticipated MOD: NSVD    Carlean Jews, MSN, CNM [facility]

## 2019-07-28 NOTE — Anesthesia Preprocedure Evaluation (Addendum)
Anesthesia Evaluation  Patient identified by MRN, date of birth, ID band Patient awake    Reviewed: Allergy & Precautions, H&P , NPO status , Patient's Chart, lab work & pertinent test results, reviewed documented beta blocker date and time   Airway Mallampati: II  TM Distance: >3 FB Neck ROM: full    Dental no notable dental hx.    Pulmonary neg pulmonary ROS,    Pulmonary exam normal breath sounds clear to auscultation       Cardiovascular negative cardio ROS Normal cardiovascular exam Rhythm:regular Rate:Normal     Neuro/Psych negative neurological ROS  negative psych ROS   GI/Hepatic negative GI ROS, Neg liver ROS,   Endo/Other  negative endocrine ROS  Renal/GU negative Renal ROS  negative genitourinary   Musculoskeletal   Abdominal   Peds  Hematology negative hematology ROS (+)   Anesthesia Other Findings   Reproductive/Obstetrics (+) Pregnancy                             Anesthesia Physical Anesthesia Plan  ASA: III  Anesthesia Plan: Epidural   Post-op Pain Management:    Induction:   PONV Risk Score and Plan:   Airway Management Planned:   Additional Equipment:   Intra-op Plan:   Post-operative Plan:   Informed Consent: I have reviewed the patients History and Physical, chart, labs and discussed the procedure including the risks, benefits and alternatives for the proposed anesthesia with the patient or authorized representative who has indicated his/her understanding and acceptance.       Plan Discussed with: Anesthesiologist and CRNA  Anesthesia Plan Comments: (Idiopathic thrombocytopenia )        Anesthesia Quick Evaluation

## 2019-07-28 NOTE — Progress Notes (Signed)
S: comfortable  O: BP 117/80   Pulse 95   Temp 98.8 F (37.1 C) (Oral)   Resp 18   LMP 11/01/2018 (Exact Date)   SpO2 100%  Pitocin 8 miu Epidural VE loose 4cm/ soft/-3 deviated to left  Tracing" baseline 140 (+) accels  irreg ctx CBC Latest Ref Rng & Units 07/28/2019 07/22/2019 06/26/2019  WBC 4.0 - 10.5 K/uL 7.6 8.4 8.0  Hemoglobin 12.0 - 15.0 g/dL 37.3 66.8 11.7(L)  Hematocrit 36.0 - 46.0 % 38.0 36.3 35.1(L)  Platelets 150 - 400 K/uL 93(L) 90(L) 97(L)  IMP: thrombocytopenia in pregnancy Term gestation GBS cx (+) on IV PCN P) left exaggerated sims. Defer amniotomy. Continue with pitocin

## 2019-07-28 NOTE — Anesthesia Procedure Notes (Signed)
Epidural Patient location during procedure: OB Start time: 07/28/2019 11:23 AM End time: 07/28/2019 11:27 AM  Staffing Anesthesiologist: Bethena Midget, MD  Preanesthetic Checklist Completed: patient identified, IV checked, site marked, risks and benefits discussed, surgical consent, monitors and equipment checked, pre-op evaluation and timeout performed  Epidural Patient position: sitting Prep: DuraPrep and site prepped and draped Patient monitoring: continuous pulse ox and blood pressure Approach: midline Location: L3-L4 Injection technique: LOR air  Needle:  Needle type: Tuohy  Needle gauge: 17 G Needle length: 9 cm and 9 Needle insertion depth: 9 cm Catheter type: closed end flexible Catheter size: 19 Gauge Catheter at skin depth: 14 cm Test dose: negative  Assessment Events: blood not aspirated, injection not painful, no injection resistance, no paresthesia and negative IV test

## 2019-07-28 NOTE — Progress Notes (Addendum)
S: no complaint  O; BP (!) 108/50   Pulse 72   Temp (!) 100.7 F (38.2 C) (Oral)   Resp 16   LMP 11/01/2018 (Exact Date)   SpO2 100%  Pitocin 12 miu  VE 4/50/-3 deviated to left AROM clear fluid IUPC, ISE placed  Tracing: baseline 140 (+) accel Ctx q 2-4 mins  IMP: latent phase GBS cx (+) on IV PCN  thrombocytopenia  In  preg P) left exaggerated sims. Cont increased pitocin  Addendum: Temp noted. IV PCN x 3 doses  Membranes just ruptured. No evidence of fetal tachycardia ? Etiology ? dehydration P) tylenol 1g po . IVF bolus. Watch closely

## 2019-07-28 NOTE — H&P (Signed)
Melissa Li is a 34 y.o. female presenting @ 2 3/7 week for IOL due to thrombocytopenia. Pt has been followed by heme/onc since 32 wk. plt ct 90K OB History    Gravida  3   Para  2   Term  2   Preterm  0   AB  0   Living  2     SAB  0   TAB  0   Ectopic  0   Multiple  0   Live Births  1          Past Medical History:  Diagnosis Date  . PCOS (polycystic ovarian syndrome)   . Postpartum care following vaginal delivery (03/30/13) 03/31/2013  . Pregnancy, supervision of, high-risk 01/14/2013  . SVD (spontaneous vaginal delivery) 03/31/2013  . Thrombocytopenia complicating pregnancy ([facility]) 2010   History reviewed. No pertinent surgical history. Family History: family history includes Diabetes in her mother and paternal aunt; Hypertension in her mother. Social History:  reports that she has never smoked. She has never used smokeless tobacco. She reports previous alcohol use. She reports that she does not use drugs.     Maternal Diabetes: No Genetic Screening: Declined Maternal Ultrasounds/Referrals: Normal Fetal Ultrasounds or other Referrals:  None Maternal Substance Abuse:  No Significant Maternal Medications:  None Significant Maternal Lab Results:  Group B Strep positive Other Comments:  thrombocytopenia in pregnancy, SS trait  Review of Systems  All other systems reviewed and are negative.  History Dilation: 4 Effacement (%): 50 Station: -2 Exam by:: Cher Nakai RN Blood pressure 117/80, pulse 95, temperature 98.8 F (37.1 C), temperature source Oral, resp. rate 18, last menstrual period 11/01/2018, SpO2 100 %, unknown if currently breastfeeding. Exam Physical Exam  Constitutional: She is oriented to person, place, and time. She appears well-developed and well-nourished.  Cardiovascular: Normal rate and regular rhythm.  Respiratory: Breath sounds normal.  GI: Soft.  Musculoskeletal:     Cervical back: Neck supple.  Neurological: She is alert  and oriented to person, place, and time.  Skin: Skin is warm and dry.  Psychiatric: She has a normal mood and affect.    Prenatal labs: ABO, Rh: --/--/O POS, O POS Performed at [facility], [address] [place], [place] [address]  [phone]) Antibody: NEG (03/23 1517) Rubella: Nonimmune (09/02 0000) RPR: NON REACTIVE (03/23 6160)  HBsAg: Negative (09/02 0000)  HIV: Non-reactive (09/02 0000)  GBS: positive   Assessment/Plan: Thrombocytopenia in pregnancy Term gestation GBS cx (+) Desires sterilization P) admit routine labs. Early epidural due to plt ct. Pitocin induction. Intracervical balloon done earlier. Amniotomy prn, IV PCN   Nachmen Mansel A Merrilee Ancona 07/28/2019, 2:13 PM

## 2019-07-29 ENCOUNTER — Inpatient Hospital Stay (HOSPITAL_COMMUNITY): Payer: BC Managed Care – PPO | Admitting: Anesthesiology

## 2019-07-29 ENCOUNTER — Encounter (HOSPITAL_COMMUNITY): Payer: Self-pay | Admitting: Obstetrics and Gynecology

## 2019-07-29 ENCOUNTER — Encounter (HOSPITAL_COMMUNITY)
Admission: AD | Disposition: A | Discharge status: Home / Self Care | Payer: Self-pay | Source: Home / Self Care | Attending: Obstetrics and Gynecology

## 2019-07-29 DIAGNOSIS — Z23 Encounter for immunization: Secondary | ICD-10-CM | POA: Diagnosis not present

## 2019-07-29 DIAGNOSIS — O329XX0 Maternal care for malpresentation of fetus, unspecified, not applicable or unspecified: Secondary | ICD-10-CM | POA: Diagnosis not present

## 2019-07-29 DIAGNOSIS — Z3A38 38 weeks gestation of pregnancy: Secondary | ICD-10-CM | POA: Diagnosis not present

## 2019-07-29 DIAGNOSIS — O9912 Other diseases of the blood and blood-forming organs and certain disorders involving the immune mechanism complicating childbirth: Secondary | ICD-10-CM | POA: Diagnosis not present

## 2019-07-29 DIAGNOSIS — Z302 Encounter for sterilization: Secondary | ICD-10-CM | POA: Diagnosis not present

## 2019-07-29 HISTORY — PX: TUBAL LIGATION: SHX77

## 2019-07-29 LAB — CBC
HCT: 32.1 % — ABNORMAL LOW (ref 36.0–46.0)
HCT: 31 % — ABNORMAL LOW (ref 36.0–46.0)
Hemoglobin: 10.9 g/dL — ABNORMAL LOW (ref 12.0–15.0)
Hemoglobin: 10.4 g/dL — ABNORMAL LOW (ref 12.0–15.0)
MCH: 26.7 pg (ref 26.0–34.0)
MCH: 26.6 pg (ref 26.0–34.0)
MCHC: 34 g/dL (ref 30.0–36.0)
MCHC: 33.5 g/dL (ref 30.0–36.0)
MCV: 78.5 fL — ABNORMAL LOW (ref 80.0–100.0)
MCV: 79.3 fL — ABNORMAL LOW (ref 80.0–100.0)
Platelets: 79 10*3/uL — ABNORMAL LOW (ref 150–400)
Platelets: 74 10*3/uL — ABNORMAL LOW (ref 150–400)
RBC: 4.09 MIL/uL (ref 3.87–5.11)
RBC: 3.91 MIL/uL (ref 3.87–5.11)
RDW: 14.6 % (ref 11.5–15.5)
RDW: 14.9 % (ref 11.5–15.5)
WBC: 15.7 10*3/uL — ABNORMAL HIGH (ref 4.0–10.5)
WBC: 13.1 10*3/uL — ABNORMAL HIGH (ref 4.0–10.5)
nRBC: 0 % (ref 0.0–0.2)
nRBC: 0 % (ref 0.0–0.2)

## 2019-07-29 SURGERY — LIGATION, FALLOPIAN TUBE, POSTPARTUM
Anesthesia: Epidural | Laterality: Bilateral

## 2019-07-29 MED ORDER — OXYCODONE HCL 5 MG/5ML PO SOLN: 5.0000 mg | Freq: Once | ORAL | Status: DC | PRN

## 2019-07-29 MED ORDER — ONDANSETRON HCL 4 MG/2ML IJ SOLN: 4.0000 mg | INTRAMUSCULAR | Status: DC | PRN

## 2019-07-29 MED ORDER — DEXTROSE IN LACTATED RINGERS 5 % IV SOLN: INTRAVENOUS | Status: DC

## 2019-07-29 MED ORDER — FENTANYL CITRATE (PF) 100 MCG/2ML IJ SOLN
25.0000 ug | INTRAMUSCULAR | Status: DC | PRN
  Administered 2019-07-29: 25 ug via INTRAVENOUS
  Administered 2019-07-29: 50 ug via INTRAVENOUS

## 2019-07-29 MED ORDER — COCONUT OIL OIL: 1.0000 "application " | TOPICAL_OIL | Status: DC | PRN

## 2019-07-29 MED ORDER — MEPERIDINE HCL 25 MG/ML IJ SOLN: 6.2500 mg | INTRAMUSCULAR | Status: DC | PRN

## 2019-07-29 MED ORDER — METOCLOPRAMIDE HCL 10 MG PO TABS
10.0000 mg | ORAL_TABLET | Freq: Once | ORAL | Status: AC
  Administered 2019-07-29: 10 mg via ORAL
  Filled 2019-07-29: qty 1

## 2019-07-29 MED ORDER — BENZOCAINE-MENTHOL 20-0.5 % EX AERO
1.0000 "application " | INHALATION_SPRAY | CUTANEOUS | Status: DC | PRN
  Administered 2019-07-29: 1 via TOPICAL
  Filled 2019-07-29: qty 56

## 2019-07-29 MED ORDER — LACTATED RINGERS IV SOLN: INTRAVENOUS | Status: DC

## 2019-07-29 MED ORDER — SENNOSIDES-DOCUSATE SODIUM 8.6-50 MG PO TABS
2.0000 | ORAL_TABLET | ORAL | Status: DC
  Administered 2019-07-29 – 2019-07-30 (×2): 2 via ORAL
  Filled 2019-07-29 (×2): qty 2

## 2019-07-29 MED ORDER — FENTANYL CITRATE (PF) 100 MCG/2ML IJ SOLN: 25.0000 ug | INTRAMUSCULAR | Status: DC | PRN

## 2019-07-29 MED ORDER — KETOROLAC TROMETHAMINE 30 MG/ML IJ SOLN: 30.0000 mg | Freq: Once | INTRAMUSCULAR | Status: DC | PRN

## 2019-07-29 MED ORDER — FERROUS SULFATE 325 (65 FE) MG PO TABS
325.0000 mg | ORAL_TABLET | Freq: Two times a day (BID) | ORAL | Status: DC
  Administered 2019-07-29 – 2019-07-31 (×5): 325 mg via ORAL
  Filled 2019-07-29 (×5): qty 1

## 2019-07-29 MED ORDER — BUPIVACAINE HCL (PF) 0.25 % IJ SOLN
INTRAMUSCULAR | Status: DC | PRN
  Administered 2019-07-29: 10 mL

## 2019-07-29 MED ORDER — ACETAMINOPHEN 160 MG/5ML PO SOLN: 325.0000 mg | ORAL | Status: DC | PRN

## 2019-07-29 MED ORDER — OXYCODONE HCL 5 MG PO TABS
10.0000 mg | ORAL_TABLET | ORAL | Status: DC | PRN
  Administered 2019-07-29 – 2019-07-31 (×4): 10 mg via ORAL
  Filled 2019-07-29 (×4): qty 2

## 2019-07-29 MED ORDER — SIMETHICONE 80 MG PO CHEW
80.0000 mg | CHEWABLE_TABLET | ORAL | Status: DC | PRN
  Administered 2019-07-29 – 2019-07-31 (×3): 80 mg via ORAL
  Filled 2019-07-29 (×3): qty 1

## 2019-07-29 MED ORDER — ACETAMINOPHEN 325 MG PO TABS: 325.0000 mg | ORAL_TABLET | ORAL | Status: DC | PRN

## 2019-07-29 MED ORDER — ACETAMINOPHEN 325 MG PO TABS
650.0000 mg | ORAL_TABLET | ORAL | Status: DC | PRN
  Administered 2019-07-29 – 2019-07-30 (×5): 650 mg via ORAL
  Filled 2019-07-29 (×6): qty 2

## 2019-07-29 MED ORDER — BUPIVACAINE HCL (PF) 0.25 % IJ SOLN
INTRAMUSCULAR | Status: AC
  Filled 2019-07-29: qty 20

## 2019-07-29 MED ORDER — FENTANYL CITRATE (PF) 100 MCG/2ML IJ SOLN
INTRAMUSCULAR | Status: AC
  Filled 2019-07-29: qty 2

## 2019-07-29 MED ORDER — LIDOCAINE-EPINEPHRINE (PF) 2 %-1:200000 IJ SOLN
INTRAMUSCULAR | Status: AC
  Filled 2019-07-29: qty 20

## 2019-07-29 MED ORDER — OXYCODONE HCL 5 MG PO TABS
5.0000 mg | ORAL_TABLET | ORAL | Status: DC | PRN
  Administered 2019-07-29: 5 mg via ORAL
  Filled 2019-07-29: qty 1

## 2019-07-29 MED ORDER — BUPIVACAINE HCL (PF) 0.25 % IJ SOLN
INTRAMUSCULAR | Status: AC
  Filled 2019-07-29: qty 10

## 2019-07-29 MED ORDER — FAMOTIDINE 20 MG PO TABS
40.0000 mg | ORAL_TABLET | Freq: Once | ORAL | Status: AC
  Administered 2019-07-29: 40 mg via ORAL
  Filled 2019-07-29: qty 2

## 2019-07-29 MED ORDER — ONDANSETRON HCL 4 MG PO TABS: 4.0000 mg | ORAL_TABLET | ORAL | Status: DC | PRN

## 2019-07-29 MED ORDER — ONDANSETRON HCL 4 MG/2ML IJ SOLN: 4.0000 mg | Freq: Once | INTRAMUSCULAR | Status: DC | PRN

## 2019-07-29 MED ORDER — ZOLPIDEM TARTRATE 5 MG PO TABS: 5.0000 mg | ORAL_TABLET | Freq: Every evening | ORAL | Status: DC | PRN

## 2019-07-29 MED ORDER — PRENATAL MULTIVITAMIN CH
1.0000 | ORAL_TABLET | Freq: Every day | ORAL | Status: DC
  Administered 2019-07-29 – 2019-07-31 (×3): 1 via ORAL
  Filled 2019-07-29 (×3): qty 1

## 2019-07-29 MED ORDER — LIDOCAINE-EPINEPHRINE (PF) 2 %-1:200000 IJ SOLN
INTRAMUSCULAR | Status: DC | PRN
  Administered 2019-07-29: 5 mL via EPIDURAL
  Administered 2019-07-29: 10 mL via EPIDURAL
  Administered 2019-07-29: 5 mL via EPIDURAL

## 2019-07-29 MED ORDER — DIBUCAINE (PERIANAL) 1 % EX OINT: 1.0000 "application " | TOPICAL_OINTMENT | CUTANEOUS | Status: DC | PRN

## 2019-07-29 MED ORDER — WITCH HAZEL-GLYCERIN EX PADS: 1.0000 "application " | MEDICATED_PAD | CUTANEOUS | Status: DC | PRN

## 2019-07-29 MED ORDER — DIPHENHYDRAMINE HCL 25 MG PO CAPS: 25.0000 mg | ORAL_CAPSULE | Freq: Four times a day (QID) | ORAL | Status: DC | PRN

## 2019-07-29 MED ORDER — IBUPROFEN 600 MG PO TABS
600.0000 mg | ORAL_TABLET | Freq: Four times a day (QID) | ORAL | Status: DC
  Filled 2019-07-29: qty 1

## 2019-07-29 MED ORDER — OXYCODONE HCL 5 MG PO TABS: 5.0000 mg | ORAL_TABLET | Freq: Once | ORAL | Status: DC | PRN

## 2019-07-29 SURGICAL SUPPLY — 27 items
CATH ROBINSON RED A/P 16FR (CATHETERS) IMPLANT
CLOSURE WOUND 1/4 X3 (GAUZE/BANDAGES/DRESSINGS) ×1
CLOTH BEACON ORANGE TIMEOUT ST (SAFETY) ×3 IMPLANT
DRSG OPSITE POSTOP 3X4 (GAUZE/BANDAGES/DRESSINGS) ×3 IMPLANT
DRSG OPSITE POSTOP 4X10 (GAUZE/BANDAGES/DRESSINGS) ×2 IMPLANT
DURAPREP 26ML APPLICATOR (WOUND CARE) ×3 IMPLANT
ELECT REM PT RETURN 9FT ADLT (ELECTROSURGICAL) ×3
ELECTRODE REM PT RTRN 9FT ADLT (ELECTROSURGICAL) ×1 IMPLANT
GLOVE BIOGEL PI IND STRL 7.0 (GLOVE) ×3 IMPLANT
GLOVE BIOGEL PI INDICATOR 7.0 (GLOVE) ×6
GLOVE ECLIPSE 6.5 STRL STRAW (GLOVE) ×3 IMPLANT
GOWN STRL REUS W/TWL LRG LVL3 (GOWN DISPOSABLE) ×6 IMPLANT
NEEDLE HYPO 22GX1.5 SAFETY (NEEDLE) ×3 IMPLANT
NS IRRIG 1000ML POUR BTL (IV SOLUTION) ×3 IMPLANT
PACK ABDOMINAL MINOR (CUSTOM PROCEDURE TRAY) ×3 IMPLANT
PENCIL BUTTON HOLSTER BLD 10FT (ELECTRODE) ×3 IMPLANT
PROTECTOR NERVE ULNAR (MISCELLANEOUS) ×3 IMPLANT
SPONGE LAP 4X18 RFD (DISPOSABLE) IMPLANT
STRIP CLOSURE SKIN 1/4X3 (GAUZE/BANDAGES/DRESSINGS) ×2 IMPLANT
SUT CHROMIC GUT AB #0 18 (SUTURE) ×3 IMPLANT
SUT VIC AB 0 CT1 27 (SUTURE) ×2
SUT VIC AB 0 CT1 27XBRD ANBCTR (SUTURE) ×1 IMPLANT
SUT VICRYL 4-0 PS2 18IN ABS (SUTURE) ×3 IMPLANT
SYR CONTROL 10ML LL (SYRINGE) ×3 IMPLANT
TOWEL OR 17X24 6PK STRL BLUE (TOWEL DISPOSABLE) ×6 IMPLANT
TRAY FOLEY CATH SILVER 14FR (SET/KITS/TRAYS/PACK) IMPLANT
WATER STERILE IRR 1000ML POUR (IV SOLUTION) ×3 IMPLANT

## 2019-07-29 NOTE — Anesthesia Preprocedure Evaluation (Addendum)
Anesthesia Evaluation  Patient identified by MRN, date of birth, ID band Patient awake    Reviewed: Allergy & Precautions, NPO status , Patient's Chart, lab work & pertinent test results  Airway Mallampati: II  TM Distance: >3 FB Neck ROM: Full    Dental no notable dental hx. (+) Teeth Intact, Dental Advisory Given   Pulmonary neg pulmonary ROS,    Pulmonary exam normal breath sounds clear to auscultation       Cardiovascular negative cardio ROS Normal cardiovascular exam Rhythm:Regular Rate:Normal     Neuro/Psych negative neurological ROS  negative psych ROS   GI/Hepatic negative GI ROS, Neg liver ROS,   Endo/Other  negative endocrine ROS  Renal/GU negative Renal ROS  negative genitourinary   Musculoskeletal negative musculoskeletal ROS (+)   Abdominal   Peds  Hematology  (+) Blood dyscrasia (gestational thrombocytopenia, plt 79), ,   Anesthesia Other Findings PPTL, epidural in place and functioned well for delivery  Reproductive/Obstetrics                             Anesthesia Physical Anesthesia Plan  ASA: III  Anesthesia Plan: Epidural   Post-op Pain Management:    Induction:   PONV Risk Score and Plan: 2 and Treatment may vary due to age or medical condition  Airway Management Planned: Natural Airway  Additional Equipment:   Intra-op Plan:   Post-operative Plan:   Informed Consent: I have reviewed the patients History and Physical, chart, labs and discussed the procedure including the risks, benefits and alternatives for the proposed anesthesia with the patient or authorized representative who has indicated his/her understanding and acceptance.       Plan Discussed with: Anesthesiologist  Anesthesia Plan Comments: (Plan to activate pre-existing epidural)        Anesthesia Quick Evaluation

## 2019-07-29 NOTE — Anesthesia Postprocedure Evaluation (Signed)
Anesthesia Post Note  Patient: Melissa Li  Procedure(s) Performed: AN AD HOC LABOR EPIDURAL     Patient location during evaluation: Mother Baby Anesthesia Type: Epidural Level of consciousness: awake Pain management: satisfactory to patient Vital Signs Assessment: post-procedure vital signs reviewed and stable Respiratory status: spontaneous breathing Cardiovascular status: stable Anesthetic complications: no    Last Vitals:  Vitals:   07/29/19 0331 07/29/19 0430  BP: 111/69 (!) 103/53  Pulse: 91 89  Resp: 16 17  Temp: 37.3 C 37.3 C  SpO2: 100% 100%    Last Pain:  Vitals:   07/29/19 0430  TempSrc: Oral  PainSc:    Pain Goal:                   [employer]

## 2019-07-29 NOTE — Progress Notes (Signed)
Late entry: Called for FHR deceleration down to 60's-80   En route to hospital again notified fhr 80's with slowing returning. On Call Faculty Dr Adrian Blackwater called. On arrival. FHR was back up to 155-160 with good variability.  exam showed fully -1 station. Apparently FHR decel occurred after attempt at pushing x1. Prior to decel. Pt had variables with ctx.Marland Kitchen good variability and (+)accels. Pitocin off. Pt was on all fours. Repositioned and start pushing. Pitocin resumed. See delivery note

## 2019-07-29 NOTE — Anesthesia Postprocedure Evaluation (Signed)
Anesthesia Post Note  Patient: Melissa Li  Procedure(s) Performed: POST PARTUM TUBAL LIGATION (Bilateral )     Patient location during evaluation: PACU Anesthesia Type: Epidural Level of consciousness: awake and alert Pain management: pain level controlled Vital Signs Assessment: post-procedure vital signs reviewed and stable Respiratory status: spontaneous breathing, nonlabored ventilation and respiratory function stable Cardiovascular status: stable Postop Assessment: no headache, no backache and epidural receding Anesthetic complications: no    Last Vitals:  Vitals:   07/29/19 1415 07/29/19 1430  BP: (!) 110/58 111/62  Pulse: 84 72  Resp: 18 (!) 22  Temp:    SpO2: 100% 100%    Last Pain:  Vitals:   07/29/19 1430  TempSrc:   PainSc: 0-No pain   Pain Goal:    LLE Motor Response: Purposeful movement (07/29/19 1430) LLE Sensation: Tingling (07/29/19 1430) RLE Motor Response: Purposeful movement (07/29/19 1430) RLE Sensation: Tingling (07/29/19 1430)     Epidural/Spinal Function Cutaneous sensation: Able to Wiggle Toes (07/29/19 1430), Patient able to flex knees: Yes (07/29/19 1430), Patient able to lift hips off bed: No (07/29/19 1430), Back pain beyond tenderness at insertion site: No (07/29/19 1430), Progressively worsening motor and/or sensory loss: No (07/29/19 1430), Bowel and/or bladder incontinence post epidural: No (07/29/19 1430)  Furqan Gosselin L Adler Chartrand

## 2019-07-29 NOTE — Lactation Note (Signed)
This note was copied from a baby's chart. Lactation Consultation Note  Patient Name: Boy Alessia Gonsalez NIOEV'O Date: 07/29/2019 Reason for consult: Follow-up assessment  P3 mother whose infant is now 63 hours old.  This is an ETI at 38+4 weeks.  Mother breast fed her first child (now 36 years old) for 8 months and her second child (now 66 years old) for 11 months.  Mother was trying to rest when I arrived; she will be having a tubal today.  Baby was asleep in the bassinet.  Mother had no questions/concerns related to breast feeding.  She is familiar with feeding cues and hand expression.  Mother has breast shells, manual pump and a DEBP at bedside.  She has been instructed on pumping.    Mother has a DEBP for home use.  No support person present at this time.  She knows that baby will go to the nursery when she is having her tubal.  Encouraged to call for any questions, concerns or latch assistance as needed.      Maternal Data    Feeding Feeding Type: Breast Fed  LATCH Score                   Interventions    Lactation Tools Discussed/Used     Consult Status Consult Status: Follow-up Date: 07/30/19 Follow-up type: In-patient    Kaycen Whitworth R Addis Bennie 07/29/2019, 10:05 AM

## 2019-07-29 NOTE — Progress Notes (Signed)
Pt request permanent sterilization. Procedure explained. Risk of surgery reviewed including infection, bleeding, failure rate 1/300, nonreversible, permanent sterilization. Consent signed

## 2019-07-29 NOTE — Lactation Note (Signed)
This note was copied from a baby's chart. Lactation Consultation Note  Patient Name: Melissa Li YWVPX'T Date: 07/29/2019 Reason for consult: Initial assessment;Early term 37-38.6wks P3, 4 hour ETI infant. Infant had one void since birth. Per mom, she does have a DEBP at home. Tools given: hand pump and breast shells due to mom having flat nipple on right breast only.  DEBP given to help with breast stimulation, induction to help establish mom's milk supply. Per mom, infant is latching well, breastfed for 30 minutes at 4:15 am, LC did not observe latch, mom is doing STS with infant. Mom is experienced at breastfeeding she breastfed her 36 year old for 8 months, BF her 36 year old for 11 months. Per mom, she did use breast shells with her other two children and her colostrum was not present until 20 to 24 hours of infant's life. LC discussed with mom to use DEBP after latching infant to breast , for breast stimulation and to help establish milk supply. Mom taught back hand expression but colostrum not present currently, mom will continue to latch infant at breast and do STS with infant. Mom knows to breastfeed infant according to hunger cues, 8 to 12 times within 24 hours and not exceed 3 hours without breastfeeding infant. Mom knows to call RN or LC if she needs assistance with latching infant at breast. LC discussed after 24 hours, if mom needs to supplement we do have donor breast milk available. Mom will use DEBP every 3 hours for 15 minutes on initial setting. Mom shown how to use DEBP & how to disassemble, clean, & reassemble parts. Reviewed Baby & Me book's Breastfeeding Basics.  LC discussed breastfeeding community resources after hospital discharge: [facility] hotline, [facility] outpatient clinic and [facility] online breastfeeding support group.      Maternal Data Formula Feeding for Exclusion: No Has patient been taught Hand Expression?: Yes Does the patient have breastfeeding experience prior to  this delivery?: Yes  Feeding Feeding Type: Breast Fed  LATCH Score Latch: Repeated attempts needed to sustain latch, nipple held in mouth throughout feeding, stimulation needed to elicit sucking reflex.  Audible Swallowing: A few with stimulation  Type of Nipple: Everted at rest and after stimulation  Comfort (Breast/Nipple): Soft / non-tender  Hold (Positioning): Assistance needed to correctly position infant at breast and maintain latch.  LATCH Score: 7  Interventions Interventions: Breast feeding basics reviewed;Hand express;Expressed milk;Pre-pump if needed;Shells;DEBP;Hand pump  Lactation Tools Discussed/Used Tools: Pump;Shells Shell Type: Other (comment)(Mom's right breast is flat) Breast pump type: Double-Electric Breast Pump;Manual WIC Program: No Pump Review: Setup, frequency, and cleaning;Milk Storage Initiated by:: Danelle Earthly, IBCLC Date initiated:: 07/29/19   Consult Status Consult Status: Follow-up Date: 07/29/19 Follow-up type: In-patient    Danelle Earthly 07/29/2019, 5:43 AM

## 2019-07-29 NOTE — Progress Notes (Signed)
Pt platelet count 74. Contacted PACU to remove epidural. Per PACU, they will consult with anesthesiologist to determine whether or not to remove. Dr. Cherly Hensen aware. No new orders received.

## 2019-07-29 NOTE — Brief Op Note (Signed)
07/28/2019 - 07/29/2019  1:58 PM  PATIENT:  Melissa Li  34 y.o. female  PRE-OPERATIVE DIAGNOSIS: desires sterilization. S/p vag delivery  POST-OPERATIVE DIAGNOSIS:  desires sterilization, s/p vaginal delivery  PROCEDURE:  Modified Pomeroy TL  SURGEON:  Surgeon(s) and Role:    * Jackline Castilla, Insurance risk surveyor, MD - Primary  PHYSICIAN ASSISTANT:   ASSISTANTS: none   ANESTHESIA:   epidural Findings, nl tubes, palp nl ovaries, uterus at umb EBL: minimal  BLOOD ADMINISTERED:none  DRAINS: none   LOCAL MEDICATIONS USED:  MARCAINE     SPECIMEN:  Source of Specimen:  portion of right and left tube  DISPOSITION OF SPECIMEN:  PATHOLOGY  COUNTS:  YES  TOURNIQUET:  * No tourniquets in log *  DICTATION: .Other Dictation: Dictation Number [id number]  PLAN OF CARE: Admit to inpatient   PATIENT DISPOSITION:  PACU - hemodynamically stable.   Delay start of Pharmacological VTE agent (>24hrs) due to surgical blood loss or risk of bleeding: no

## 2019-07-30 ENCOUNTER — Encounter: Payer: Self-pay | Admitting: Certified Registered Nurse Anesthetist

## 2019-07-30 LAB — CBC
HCT: 31.7 % — ABNORMAL LOW (ref 36.0–46.0)
Hemoglobin: 10.6 g/dL — ABNORMAL LOW (ref 12.0–15.0)
MCH: 26.8 pg (ref 26.0–34.0)
MCHC: 33.4 g/dL (ref 30.0–36.0)
MCV: 80.1 fL (ref 80.0–100.0)
Platelets: 77 10*3/uL — ABNORMAL LOW (ref 150–400)
RBC: 3.96 MIL/uL (ref 3.87–5.11)
RDW: 14.9 % (ref 11.5–15.5)
WBC: 10.4 10*3/uL (ref 4.0–10.5)
nRBC: 0 % (ref 0.0–0.2)

## 2019-07-30 LAB — SURGICAL PATHOLOGY

## 2019-07-30 MED ORDER — ACETAMINOPHEN 500 MG PO TABS
1000.0000 mg | ORAL_TABLET | Freq: Four times a day (QID) | ORAL | Status: DC | PRN
  Administered 2019-07-30 – 2019-07-31 (×3): 1000 mg via ORAL
  Filled 2019-07-30 (×4): qty 2

## 2019-07-30 NOTE — Transfer of Care (Signed)
Immediate Anesthesia Transfer of Care Note  Patient: ERYANNA REGAL  Procedure(s) Performed: POST PARTUM TUBAL LIGATION (Bilateral )  Patient Location: PACU  Anesthesia Type:Regional  Level of Consciousness: awake, alert  and oriented  Airway & Oxygen Therapy: Patient Spontanous Breathing and Patient connected to nasal cannula oxygen  Post-op Assessment: Report given to RN and Post -op Vital signs reviewed and stable  Post vital signs: Reviewed and stable  Last Vitals:  Vitals Value Taken Time  BP 112/78 07/30/19 0551  Temp 36.7 C 07/30/19 0414  Pulse 98 07/30/19 0551  Resp 18 07/30/19 0414  SpO2 99 % 07/30/19 0551  Vitals shown include unvalidated device data.  Last Pain:  Vitals:   07/30/19 0414  TempSrc: Oral  PainSc:          Complications: No apparent anesthesia complications

## 2019-07-30 NOTE — Progress Notes (Addendum)
PPD #1, VAVD, s/p bilateral tubal ligation, baby boy  "Ronterius"  S:  Reports feeling okay, some soreness, but wants to avoid narcotics  No flatus, but belching              Tolerating po/ No nausea or vomiting / Denies dizziness or SOB             Bleeding is light             Pain controlled with Tylenol             Up ad lib / ambulatory / voiding QS  Newborn breast feeding - going well, baby cluster feeding  / Circumcision - completed  O:               VS: BP 119/76 (BP Location: Right Arm)   Pulse 67   Temp 98.1 F (36.7 C) (Oral)   Resp 18   LMP 11/01/2018 (Exact Date)   SpO2 100%   Breastfeeding Unknown    LABS:              Recent Labs    07/29/19 1441 07/30/19 0507  WBC 13.1* 10.4  HGB 10.4* 10.6*  PLT 74* 77*   Results for orders placed or performed during the hospital encounter of 07/28/19 (from the past 24 hour(s))  CBC     Status: Abnormal   Collection Time: 07/29/19  2:41 PM  Result Value Ref Range   WBC 13.1 (H) 4.0 - 10.5 K/uL   RBC 3.91 3.87 - 5.11 MIL/uL   Hemoglobin 10.4 (L) 12.0 - 15.0 g/dL   HCT 31.0 (L) 36.0 - 46.0 %   MCV 79.3 (L) 80.0 - 100.0 fL   MCH 26.6 26.0 - 34.0 pg   MCHC 33.5 30.0 - 36.0 g/dL   RDW 14.9 11.5 - 15.5 %   Platelets 74 (L) 150 - 400 K/uL   nRBC 0.0 0.0 - 0.2 %  CBC     Status: Abnormal   Collection Time: 07/30/19  5:07 AM  Result Value Ref Range   WBC 10.4 4.0 - 10.5 K/uL   RBC 3.96 3.87 - 5.11 MIL/uL   Hemoglobin 10.6 (L) 12.0 - 15.0 g/dL   HCT 31.7 (L) 36.0 - 46.0 %   MCV 80.1 80.0 - 100.0 fL   MCH 26.8 26.0 - 34.0 pg   MCHC 33.4 30.0 - 36.0 g/dL   RDW 14.9 11.5 - 15.5 %   Platelets 77 (L) 150 - 400 K/uL   nRBC 0.0 0.0 - 0.2 %               Blood type: --/--/O POS, O POS Performed at [facility], [address], [place], [place] [address]  [phone])  Rubella: Nonimmune, Immune (09/02 0000)                     I&O: Intake/Output      03/24 0701 - 03/25 0700 03/25 0701 - 03/26 0700   I.V. 1400    Other     IV Piggyback     Total Intake 1400    Urine 100    Blood 20    Total Output 120    Net +1280                       Physical Exam:             Alert and oriented X3  Lungs: Clear and unlabored  Heart: regular rate and rhythm / no murmurs  Abdomen: soft, non-tender, moderate gaseous distention, active bowel sounds. Umbilical incision with honeycomb dressing c/d/i             Fundus: firm, non-tender, U-1  Perineum: intact  Lochia: small lochia  Extremities: trace LE edema, no calf pain or tenderness    A/P: PPD # 1, VAVD  S/p Bilateral tubal ligation  ABL Anemia compounding chronic IDA   - stable on oral FE  Chronic Thrombocytopenia with gestational exacerbation   - epidural in place   - Consulted with Dr. Stephannie Peters and okay to d/c epidural catheter since plts are stable and greater than 70. Platelet transfusion not indicated at this time   - Repeat CBC in AM   - No NSAIDs  Doing well - stable status  Routine post partum orders  Warm liquids, ambulation to promote bowel motility  Lactation support  May shower today  Anticipate d/c home tomorrow   POC in consult with Dr. Cherly Hensen and Dr. Stephannie Peters (anesthesia)  Carlean Jews, MSN, CNM [facility]

## 2019-07-30 NOTE — Op Note (Signed)
Melissa Li, SCHLUP MEDICAL RECORD [id number] ACCOUNT [id number] DATE OF BIRTH:[birth year] FACILITY: [facility] LOCATION: [facility] PHYSICIAN:Safina Huard A. Shanise Balch, MD  OPERATIVE REPORT  DATE OF PROCEDURE:  07/29/2019  PREOPERATIVE DIAGNOSES:  Desires sterilization, status post vaginal delivery.  PROCEDURE:  Modified Pomeroy bilateral tubal ligation.  POSTOPERATIVE DIAGNOSES:  Desires sterilization, status post vaginal delivery 07/29/2019.  ANESTHESIA:  Epidural.  SURGEON:  Maxie Better, MD  ASSISTANT:  None.  DESCRIPTION OF PROCEDURE:  Under adequate epidural anesthesia, the patient was placed in the supine position and an indwelling Foley catheter was already in place.  The patient was sterilely prepped and draped in usual fashion.  Marcaine 0.25% was  injected along the infraumbilical site and infraumbilical incision was then made, carried down to the rectus fascia.  Rectus fascia was opened transversely.  The parietal peritoneum was then opened and the uterus was noted to be at the umbilicus.   Following identification of the right fallopian tube down to its fimbriated end the midportion of the tube was grasped with a Babcock.  The underlying mesosalpinx was opened with a cautery.  The proximal and distal portion of that segment of tube was then tied with 0 chromic sutures, 2 proximally and 2 distally and the intervening segment of tube was then removed.  The same procedure was performed on the contralateral side after identifying the fimbriated end.  Midportion of that left tube was also  removed.  At that point, good hemostasis was noted.  The parietal and the fascia was closed in a pursestring suture of 0 Vicryl and the skin approximated using 4-0 Vicryl subcuticular closure.  Steri-Strips and benzoin was placed.  SPECIMEN:  Portion of right and left fallopian tubes sent to pathology.  ESTIMATED BLOOD LOSS:  Minimal.  COMPLICATIONS:  None.  DISPOSITION:  The patient  tolerated the procedure well and was transferred to recovery room in stable condition.  CN/NUANCE  D:07/29/2019 T:07/30/2019 JOB:[id number]

## 2019-07-31 ENCOUNTER — Other Ambulatory Visit: Payer: BC Managed Care – PPO

## 2019-07-31 ENCOUNTER — Ambulatory Visit: Payer: BC Managed Care – PPO | Admitting: Hematology and Oncology

## 2019-07-31 LAB — CBC
HCT: 31.1 % — ABNORMAL LOW (ref 36.0–46.0)
Hemoglobin: 10.3 g/dL — ABNORMAL LOW (ref 12.0–15.0)
MCH: 26.3 pg (ref 26.0–34.0)
MCHC: 33.1 g/dL (ref 30.0–36.0)
MCV: 79.5 fL — ABNORMAL LOW (ref 80.0–100.0)
Platelets: 81 10*3/uL — ABNORMAL LOW (ref 150–400)
RBC: 3.91 MIL/uL (ref 3.87–5.11)
RDW: 14.7 % (ref 11.5–15.5)
WBC: 9.3 10*3/uL (ref 4.0–10.5)
nRBC: 0 % (ref 0.0–0.2)

## 2019-07-31 MED ORDER — SENNOSIDES-DOCUSATE SODIUM 8.6-50 MG PO TABS: 2.0000 | ORAL_TABLET | ORAL | 1 refills | Status: DC

## 2019-07-31 MED ORDER — OXYCODONE HCL 5 MG PO TABS: 5.0000 mg | ORAL_TABLET | ORAL | 0 refills | Status: AC | PRN

## 2019-07-31 MED ORDER — FERROUS SULFATE 325 (65 FE) MG PO TABS: 325.0000 mg | ORAL_TABLET | Freq: Two times a day (BID) | ORAL | 1 refills | Status: DC

## 2019-07-31 NOTE — Discharge Summary (Signed)
OB Discharge Summary  Patient Name: Melissa Li DOB: Mar 16, 1986 MRN: [id number]  Date of admission: 07/28/2019 Delivering MD: COUSINS, SHERONETTE   Date of discharge: 07/31/2019  Admitting diagnosis: Encounter for planned induction of labor [Z34.90] Postpartum care following vaginal delivery [Z39.2] Intrauterine pregnancy: [redacted]w[redacted]d     Secondary diagnosis:Principal Problem:   Postpartum care following vaginal delivery 3/24 Active Problems:   Encounter for planned induction of labor  Additional problems:anemia, thrombocytopenia     Discharge diagnosis: Term Pregnancy Delivered and s/p PPTL, thrombocytopenia                                                                     Post partum procedures:postpartum tubal ligation  Augmentation: AROM, Pitocin and Foley Balloon  Complications: None  Hospital course:  Induction of Labor With Vaginal Delivery   34 y.o. yo G3P3003 at [redacted]w[redacted]d was admitted to the hospital 07/28/2019 for induction of labor.  Indication for induction: thrombocytopenia.  Patient had an uncomplicated labor course as follows: Membrane Rupture Time/Date: 7:36 PM ,07/28/2019   Intrapartum Procedures: Episiotomy: None [1]                                         Lacerations:  None [1]  Patient had delivery of a Viable infant.  Information for the patient's newborn:  Rozena, Fierro [[id number]]  Delivery Method: Vag-Spont    07/29/2019  Details of delivery can be found in separate delivery note.  Patient had a routine postpartum course. Patient is discharged home 07/31/19.  On day of d/c pt notes pain well controlled with tylenol and oxycodone. Not taking NSAIDs due to thrombocytopenia. Pt notes LE edema but no SOB. No N/V and tolerating po but no flatus since TL.   Physical exam  Vitals:   07/30/19 0414 07/30/19 1416 07/30/19 2133 07/31/19 0520  BP: 119/76 116/80 114/73 107/75  Pulse: 67 64 68 69  Resp: 18 18 18 18   Temp: 98.1 F (36.7 C) 98.4 F (36.9 C)  98.4 F (36.9 C) 98.7 F (37.1 C)  TempSrc: Oral Oral Oral Oral  SpO2: 100% 100% 100% 100%   General: alert, cooperative and no distress Lochia: appropriate Uterine Fundus: firm Abd: soft distension Incision: Healing well with no significant drainage. Umbilical incision dressed.  DVT Evaluation: No evidence of DVT seen on physical exam. 3+ LE edema.  Labs: Lab Results  Component Value Date   WBC 9.3 07/31/2019   HGB 10.3 (L) 07/31/2019   HCT 31.1 (L) 07/31/2019   MCV 79.5 (L) 07/31/2019   PLT 81 (L) 07/31/2019   CMP Latest Ref Rng & Units 07/22/2019  Glucose 70 - 99 mg/dL 76  BUN 6 - 20 mg/dL 6  Creatinine 07/24/2019 - 3.29 mg/dL 9.24  Sodium 2.68 - 341 mmol/L 135  Potassium 3.5 - 5.1 mmol/L 3.9  Chloride 98 - 111 mmol/L 106  CO2 22 - 32 mmol/L 24  Calcium 8.9 - 10.3 mg/dL 962)  Total Protein 6.5 - 8.1 g/dL 6.5  Total Bilirubin 0.3 - 1.2 mg/dL 0.4  Alkaline Phos 38 - 126 U/L 88  AST 15 - 41 U/L  15  ALT 0 - 44 U/L 13    Discharge instruction: per After Visit Summary and "Baby and Me Booklet". Pt instructed to call office with no flatus in 1-2 day or with any increased N/V, abdominal pain. Avoid NSAIDs, elevate legs and cont iron.   After Visit Meds:  Allergies as of 07/31/2019   No Known Allergies     Medication List    TAKE these medications   Acetaminophen 500 MG coapsule Take 500 mg by mouth as needed.   CitraNatal Harmony 27-1-260 MG Caps Take 260 mg by mouth daily.   ferrous sulfate 325 (65 FE) MG tablet Take 1 tablet (325 mg total) by mouth 2 (two) times daily with a meal.   oxyCODONE 5 MG immediate release tablet Commonly known as: Oxy IR/ROXICODONE Take 1 tablet (5 mg total) by mouth every 4 (four) hours as needed for up to 5 days (pain scale > 7). May take 1-2 pills every 4-6 hrs as needed for pain.   senna-docusate 8.6-50 MG tablet Commonly known as: Senokot-S Take 2 tablets by mouth daily. Start taking on: August 01, 2019   Tums 500 MG chewable  tablet Generic drug: calcium carbonate Chew 500 mg by mouth as needed.   vitamin B-12 1000 MCG tablet Commonly known as: CYANOCOBALAMIN Take 1 tablet (1,000 mcg total) by mouth daily.       Diet: routine diet  Activity: Advance as tolerated. Pelvic rest for 6 weeks.   Outpatient follow up:6 weeks Follow up Appt: Future Appointments  Date Time Provider [place]  09/30/2019 11:00 AM [facility] None  09/30/2019 11:30 AM Orson Slick, MD [facility] None   Follow up visit: No follow-ups on file.  Postpartum contraception: Tubal Ligation  Newborn Data: Live born female  Birth Weight: 6 lb 0.8 oz (2744 g) APGAR: 9, 9  Newborn Delivery   Birth date/time: 07/29/2019 01:31:00 Delivery type: Vaginal, Vacuum (Extractor)      Baby Feeding: Breast Disposition:home with mother   07/31/2019 Ala Dach, MD

## 2019-07-31 NOTE — Lactation Note (Signed)
This note was copied from a baby's chart. Lactation Consultation Note  Patient Name: Melissa Li MAYOK'H Date: 07/31/2019 Reason for consult: Follow-up assessment;Early term 37-38.6wks;Infant weight loss;Other (Comment)  Feeding preference - breast / formula  Per mom milk feels like it is coming in, breast are fuller hearing more swallows.  Baby has been latching well and supplementing.  Per mom has several pumps at home.  Mom mentioned nipples are alittle sore , no breakdown.  LC reviewed supply and demand / importance of giving plenty of practice with  Latching and since breast are fuller if baby is happy after breast feeding , hold off  On supplementing. Sore nipple and engorgement prevention and tx , storage of breast milk , and mom Has the [facility] pamphlet with phone numbers.    Maternal Data Has patient been taught Hand Expression?: Yes  Feeding Feeding Type: (per mom last fed at 9am)  LATCH Score                   Interventions Interventions: Breast feeding basics reviewed  Lactation Tools Discussed/Used Tools: Shells;Pump Shell Type: Inverted Breast pump type: Double-Electric Breast Pump Pump Review: Milk Storage   Consult Status Consult Status: Complete Date: 07/31/19    Kathrin Greathouse 07/31/2019, 10:26 AM

## 2019-09-09 DIAGNOSIS — Z130 Encounter for screening for diseases of the blood and blood-forming organs and certain disorders involving the immune mechanism: Secondary | ICD-10-CM | POA: Diagnosis not present

## 2019-09-09 DIAGNOSIS — Z124 Encounter for screening for malignant neoplasm of cervix: Secondary | ICD-10-CM | POA: Diagnosis not present

## 2019-09-09 DIAGNOSIS — Z1151 Encounter for screening for human papillomavirus (HPV): Secondary | ICD-10-CM | POA: Diagnosis not present

## 2019-09-30 ENCOUNTER — Ambulatory Visit: Payer: BC Managed Care – PPO | Admitting: Hematology and Oncology

## 2019-09-30 ENCOUNTER — Other Ambulatory Visit: Payer: BC Managed Care – PPO

## 2019-10-02 ENCOUNTER — Other Ambulatory Visit: Payer: Self-pay | Admitting: Hematology and Oncology

## 2019-10-02 ENCOUNTER — Inpatient Hospital Stay: Payer: BC Managed Care – PPO | Attending: Hematology and Oncology

## 2019-10-02 ENCOUNTER — Other Ambulatory Visit: Payer: Self-pay

## 2019-10-02 ENCOUNTER — Inpatient Hospital Stay: Payer: BC Managed Care – PPO | Admitting: Hematology and Oncology

## 2019-10-02 VITALS — BP 123/72 | HR 68 | Temp 97.9°F | Resp 20 | Ht 63.0 in | Wt 234.0 lb

## 2019-10-02 DIAGNOSIS — D500 Iron deficiency anemia secondary to blood loss (chronic): Secondary | ICD-10-CM | POA: Diagnosis not present

## 2019-10-02 DIAGNOSIS — D696 Thrombocytopenia, unspecified: Secondary | ICD-10-CM | POA: Diagnosis not present

## 2019-10-02 DIAGNOSIS — D649 Anemia, unspecified: Secondary | ICD-10-CM | POA: Diagnosis not present

## 2019-10-02 DIAGNOSIS — E282 Polycystic ovarian syndrome: Secondary | ICD-10-CM | POA: Insufficient documentation

## 2019-10-02 DIAGNOSIS — D6959 Other secondary thrombocytopenia: Secondary | ICD-10-CM | POA: Diagnosis not present

## 2019-10-02 LAB — CBC WITH DIFFERENTIAL (CANCER CENTER ONLY)
Abs Immature Granulocytes: 0.01 10*3/uL (ref 0.00–0.07)
Basophils Absolute: 0 10*3/uL (ref 0.0–0.1)
Basophils Relative: 0 %
Eosinophils Absolute: 0.1 10*3/uL (ref 0.0–0.5)
Eosinophils Relative: 2 %
HCT: 33.6 % — ABNORMAL LOW (ref 36.0–46.0)
Hemoglobin: 11.1 g/dL — ABNORMAL LOW (ref 12.0–15.0)
Immature Granulocytes: 0 %
Lymphocytes Relative: 34 %
Lymphs Abs: 1.9 10*3/uL (ref 0.7–4.0)
MCH: 25.8 pg — ABNORMAL LOW (ref 26.0–34.0)
MCHC: 33 g/dL (ref 30.0–36.0)
MCV: 78 fL — ABNORMAL LOW (ref 80.0–100.0)
Monocytes Absolute: 0.7 10*3/uL (ref 0.1–1.0)
Monocytes Relative: 12 %
Neutro Abs: 3 10*3/uL (ref 1.7–7.7)
Neutrophils Relative %: 52 %
Platelet Count: 136 10*3/uL — ABNORMAL LOW (ref 150–400)
RBC: 4.31 MIL/uL (ref 3.87–5.11)
RDW: 15.1 % (ref 11.5–15.5)
WBC Count: 5.7 10*3/uL (ref 4.0–10.5)
nRBC: 0 % (ref 0.0–0.2)

## 2019-10-02 LAB — RETIC PANEL
Immature Retic Fract: 12.9 % (ref 2.3–15.9)
RBC.: 4.24 MIL/uL (ref 3.87–5.11)
Retic Count, Absolute: 70.4 10*3/uL (ref 19.0–186.0)
Retic Ct Pct: 1.7 % (ref 0.4–3.1)
Reticulocyte Hemoglobin: 31.6 pg (ref >27.9)

## 2019-10-02 LAB — CMP (CANCER CENTER ONLY)
ALT: 22 U/L (ref 0–44)
AST: 18 U/L (ref 15–41)
Albumin: 3.6 g/dL (ref 3.5–5.0)
Alkaline Phosphatase: 62 U/L (ref 38–126)
Anion gap: 9 (ref 5–15)
BUN: 13 mg/dL (ref 6–20)
CO2: 24 mmol/L (ref 22–32)
Calcium: 8.9 mg/dL (ref 8.9–10.3)
Chloride: 108 mmol/L (ref 98–111)
Creatinine: 0.92 mg/dL (ref 0.44–1.00)
GFR, Est AFR Am: >60 mL/min (ref >60)
GFR, Estimated: >60 mL/min (ref >60)
Glucose, Bld: 87 mg/dL (ref 70–99)
Potassium: 4.2 mmol/L (ref 3.5–5.1)
Sodium: 141 mmol/L (ref 135–145)
Total Bilirubin: 0.2 mg/dL — ABNORMAL LOW (ref 0.3–1.2)
Total Protein: 6.7 g/dL (ref 6.5–8.1)

## 2019-10-02 LAB — FERRITIN: Ferritin: 58 ng/mL (ref 11–307)

## 2019-10-02 LAB — IRON AND TIBC
Iron: 73 ug/dL (ref 28–170)
Saturation Ratios: 23 % (ref 10.4–31.8)
TIBC: 323 ug/dL (ref 250–450)
UIBC: 250 ug/dL

## 2019-10-02 LAB — SAVE SMEAR(SSMR), FOR PROVIDER SLIDE REVIEW

## 2019-10-03 ENCOUNTER — Other Ambulatory Visit: Payer: Self-pay | Admitting: Hematology and Oncology

## 2019-10-03 ENCOUNTER — Encounter: Payer: Self-pay | Admitting: Hematology and Oncology

## 2019-10-03 MED ORDER — FERROUS SULFATE 325 (65 FE) MG PO TABS: 325.0000 mg | ORAL_TABLET | Freq: Every day | ORAL | 3 refills | Status: AC

## 2019-10-03 NOTE — Progress Notes (Signed)
[place] [phone]   Fax:(336) [phone]  PROGRESS NOTE  Patient Care Team: Loraine Leriche., MD as PCP - General (Internal Medicine)  Hematological/Oncological History #Thrombocytopenia in Pregnancy #Iron Deficiency in Pregnancy 1) 04/30/2009: WBC 10.5, Hgb 12.3, MCV 82, Plt 90 (noted to be large). Birth of patient's first daughter 2) 01/26/2013: Establish care with Dr. Beryle Beams of the [facility]. WBC 8.0, Hgb 11.5, Plt 105, MCV 76.6. Measured during pregnancy with 2nd daughter. Thought to be benign thrombocytopenia of pregnancy.  3) 03/16/2013: return to Dr. Beryle Beams with interval weekly labs. Plts 98-108 4) 10/27/2013: WBC 5.2, Plt 125, Hgb 12.1. Dr. Beryle Beams notes she has a chronic mild thrombocytopenia now unrelated to pregnancy.Recommended yearly f/u. 5) 10/27/2013-06/26/2019: lost to follow up. 6) 06/26/2019: establish care with Dr. Lorenso Courier at [facility] 7) 07/22/2019: WBC 8.4, Hgb 12.0, Plt 90, MCV 78.7. 8) 07/29/2019: Delivery of healthy baby boy  9) 10/02/2019: WBC 5.7, Hgb 11.1, Plt 136, MCV 78.0  Interval History:  Melissa Li 34 y.o. female with medical history significant for thrombocytopenia and iron deficiency in pregnancy who presents for a follow up visit. The patient's last visit was on 07/22/2019. In the interim since the last visit the patient delivered a healthy baby boy on 07/29/2019. Her postpartum course was reported as "routine".  On exam today Melissa Li notes that she has felt quite well since her discharge from the hospital.  She notes that she has been tolerating her iron pills well and has been taking them once daily, despite the fact they were prescribed as twice daily.  She notes that at first she was having some constipation, but however this is subsequently resolved.  She notes that she has been doing so well that she is going back to work this week.  She denies having any issues with shortness of breath, low energy, or  fatigue.  She also endorses no issues with bleeding, bruising, nosebleeds, or dark stools.  Fortunately her baby has been healthy and without complication.  A full 10 point ROS is listed below.  MEDICAL HISTORY:  Past Medical History:  Diagnosis Date  . PCOS (polycystic ovarian syndrome)   . Postpartum care following vaginal delivery (03/30/13) 03/31/2013  . Pregnancy, supervision of, high-risk 01/14/2013  . SVD (spontaneous vaginal delivery) 03/31/2013  . Thrombocytopenia complicating pregnancy ([place]) 2010    SURGICAL HISTORY: Past Surgical History:  Procedure Laterality Date  . TUBAL LIGATION Bilateral 07/29/2019   Procedure: POST PARTUM TUBAL LIGATION;  Surgeon: Servando Salina, MD;  Location: [facility] LD ORS;  Service: Gynecology;  Laterality: Bilateral;    SOCIAL HISTORY: Social History   Socioeconomic History  . Marital status: Married    Spouse name: Not on file  . Number of children: Not on file  . Years of education: Not on file  . Highest education level: Not on file  Occupational History  . Not on file  Tobacco Use  . Smoking status: Never Smoker  . Smokeless tobacco: Never Used  Substance and Sexual Activity  . Alcohol use: Not Currently    Comment: 1 glass of wine per week prior to pregnancy  . Drug use: No  . Sexual activity: Yes    Birth control/protection: None  Other Topics Concern  . Not on file  Social History Narrative  . Not on file   Social Determinants of Health   Financial Resource Strain:   . Difficulty of Paying Living Expenses:   Food Insecurity:   . Worried About Running  Out of Food in the Last Year:   . Ran Out of Food in the Last Year:   Transportation Needs:   . Lack of Transportation (Medical):   Marland Kitchen Lack of Transportation (Non-Medical):   Physical Activity:   . Days of Exercise per Week:   . Minutes of Exercise per Session:   Stress:   . Feeling of Stress :   Social Connections:   . Frequency of Communication with Friends and  Family:   . Frequency of Social Gatherings with Friends and Family:   . Attends Religious Services:   . Active Member of Clubs or Organizations:   . Attends Banker Meetings:   Marland Kitchen Marital Status:   Intimate Partner Violence:   . Fear of Current or Ex-Partner:   . Emotionally Abused:   Marland Kitchen Physically Abused:   . Sexually Abused:     FAMILY HISTORY: Family History  Problem Relation Age of Onset  . Hypertension Mother   . Diabetes Mother   . Diabetes Paternal Aunt     ALLERGIES:  has No Known Allergies.  MEDICATIONS:  Current Outpatient Medications  Medication Sig Dispense Refill  . ferrous sulfate 325 (65 FE) MG tablet Take 1 tablet (325 mg total) by mouth 2 (two) times daily with a meal. 60 tablet 1  . Prenat-FeFmCb-DSS-FA-DHA w/o A (CITRANATAL HARMONY) 27-1-260 MG CAPS Take 260 mg by mouth daily.    . vitamin B-12 (CYANOCOBALAMIN) 1000 MCG tablet Take 1 tablet (1,000 mcg total) by mouth daily. 30 tablet 3  . Acetaminophen 500 MG coapsule Take 500 mg by mouth as needed.     No current facility-administered medications for this visit.    REVIEW OF SYSTEMS:   Constitutional: ( - ) fevers, ( - )  chills , ( - ) night sweats Eyes: ( - ) blurriness of vision, ( - ) double vision, ( - ) watery eyes Ears, nose, mouth, throat, and face: ( - ) mucositis, ( - ) sore throat Respiratory: ( - ) cough, ( - ) dyspnea, ( - ) wheezes Cardiovascular: ( - ) palpitation, ( - ) chest discomfort, ( - ) lower extremity swelling Gastrointestinal:  ( - ) nausea, ( - ) heartburn, ( - ) change in bowel habits Skin: ( - ) abnormal skin rashes Lymphatics: ( - ) new lymphadenopathy, ( - ) easy bruising Neurological: ( - ) numbness, ( - ) tingling, ( - ) new weaknesses Behavioral/Psych: ( - ) mood change, ( - ) new changes  All other systems were reviewed with the patient and are negative.  PHYSICAL EXAMINATION: ECOG PERFORMANCE STATUS: 0 - Asymptomatic  Vitals:   10/02/19 1444  BP:  123/72  Pulse: 68  Resp: 20  Temp: 97.9 F (36.6 C)  SpO2: 100%   Filed Weights   10/02/19 1444  Weight: 234 lb (106.1 kg)    GENERAL: well appearing young [place] American female alert, no distress and comfortable SKIN: skin color, texture, turgor are normal, no rashes or significant lesions EYES: conjunctiva are pink and non-injected, sclera clear LUNGS: clear to auscultation and percussion with normal breathing effort HEART: regular rate & rhythm and no murmurs and no lower extremity edema Musculoskeletal: no cyanosis of digits and no clubbing  PSYCH: alert & oriented x 3, fluent speech NEURO: no focal motor/sensory deficits  LABORATORY DATA:  I have reviewed the data as listed CBC Latest Ref Rng & Units 10/02/2019 07/31/2019 07/30/2019  WBC 4.0 - 10.5 K/uL 5.7 9.3  10.4  Hemoglobin 12.0 - 15.0 g/dL 11.1(L) 10.3(L) 10.6(L)  Hematocrit 36.0 - 46.0 % 33.6(L) 31.1(L) 31.7(L)  Platelets 150 - 400 K/uL 136(L) 81(L) 77(L)    CMP Latest Ref Rng & Units 10/02/2019 07/22/2019 06/26/2019  Glucose 70 - 99 mg/dL 87 76 094(B)  BUN 6 - 20 mg/dL 13 6 5(L)  Creatinine 0.96 - 1.00 mg/dL 2.83 6.62 9.47  Sodium 135 - 145 mmol/L 141 135 138  Potassium 3.5 - 5.1 mmol/L 4.2 3.9 4.0  Chloride 98 - 111 mmol/L 108 106 109  CO2 22 - 32 mmol/L 24 24 21(L)  Calcium 8.9 - 10.3 mg/dL 8.9 6.5(Y) 6.5(K)  Total Protein 6.5 - 8.1 g/dL 6.7 6.5 6.7  Total Bilirubin 0.3 - 1.2 mg/dL 3.5(W) 0.4 <6.5(K)  Alkaline Phos 38 - 126 U/L 62 88 72  AST 15 - 41 U/L 18 15 13(L)  ALT 0 - 44 U/L 22 13 10     RADIOGRAPHIC STUDIES: No results found.  ASSESSMENT & PLAN Melissa Li 34 y.o. female with medical history significant for thrombocytopenia and iron deficiency in pregnancy who presents for a follow up visit.  After review the labs and discussion with the patient findings are most consistent with a rebound in the platelet count, though not back up to what would be considered normal baseline.  Additionally the  patient developed an anemia during the course of the delivery, which has not yet rebounded back to normal levels.  At this time I would recommend continued use of her p.o. iron supplementation unless it were to be shown in her labs that the p.o. iron levels were inadequate to increase her iron stores.  Overall I think we can continue with continued clinical monitoring of the patient with a repeat visit in approximately 3 months to assure that her hemoglobin has stabilized and to determine where the platelet baseline is.   #Thrombocytopenia in Pregnancy #Iron Deficiency in Pregnancy --platelet count has rebounded well, Hgb dropped following delivery, but has steadily increased on PO iron therapy --Plt now at 136, up from low of 74 on 07/29/2019. Patient may have a baseline thrombocytopenia, etiology is unclear. Potentially a low grade ITP. --Hgb 11.1, iron studies pending. --recommend continued use of Ferrous sulfate 325mg  PO daily with a source of vitamin C --if iron levels remain low, can consider dosing IV iron to help increase her Hgb. If iron levels improved, will continue on current PO regimen. --RTC in 3 months time to reassess.   No orders of the defined types were placed in this encounter.  All questions were answered. The patient knows to call the clinic with any problems, questions or concerns.  A total of more than 30 minutes were spent on this encounter and over half of that time was spent on counseling and coordination of care as outlined above.   07/31/2019, MD Department of Hematology/Oncology [facility] Phone: [phone] Pager: [phone] Email: [phone]  10/03/2019 4:52 PM

## 2020-01-01 ENCOUNTER — Inpatient Hospital Stay: Payer: BC Managed Care – PPO

## 2020-01-01 ENCOUNTER — Other Ambulatory Visit: Payer: Self-pay

## 2020-01-01 ENCOUNTER — Other Ambulatory Visit: Payer: Self-pay | Admitting: *Deleted

## 2020-01-01 ENCOUNTER — Inpatient Hospital Stay: Payer: BC Managed Care – PPO | Attending: Hematology and Oncology | Admitting: Hematology and Oncology

## 2020-01-01 VITALS — BP 110/83 | HR 84 | Temp 98.8°F | Resp 19 | Ht 63.0 in | Wt 245.7 lb

## 2020-01-01 DIAGNOSIS — D500 Iron deficiency anemia secondary to blood loss (chronic): Secondary | ICD-10-CM | POA: Diagnosis not present

## 2020-01-01 DIAGNOSIS — O99119 Other diseases of the blood and blood-forming organs and certain disorders involving the immune mechanism complicating pregnancy, unspecified trimester: Secondary | ICD-10-CM | POA: Insufficient documentation

## 2020-01-01 DIAGNOSIS — D649 Anemia, unspecified: Secondary | ICD-10-CM | POA: Insufficient documentation

## 2020-01-01 DIAGNOSIS — D696 Thrombocytopenia, unspecified: Secondary | ICD-10-CM

## 2020-01-01 DIAGNOSIS — E282 Polycystic ovarian syndrome: Secondary | ICD-10-CM | POA: Diagnosis not present

## 2020-01-01 DIAGNOSIS — D6959 Other secondary thrombocytopenia: Secondary | ICD-10-CM | POA: Insufficient documentation

## 2020-01-01 DIAGNOSIS — O99019 Anemia complicating pregnancy, unspecified trimester: Secondary | ICD-10-CM | POA: Diagnosis not present

## 2020-01-01 DIAGNOSIS — D573 Sickle-cell trait: Secondary | ICD-10-CM | POA: Insufficient documentation

## 2020-01-01 LAB — CMP (CANCER CENTER ONLY)
ALT: 11 U/L (ref 0–44)
AST: 12 U/L — ABNORMAL LOW (ref 15–41)
Albumin: 3.5 g/dL (ref 3.5–5.0)
Alkaline Phosphatase: 73 U/L (ref 38–126)
Anion gap: 5 (ref 5–15)
BUN: 15 mg/dL (ref 6–20)
CO2: 28 mmol/L (ref 22–32)
Calcium: 9.7 mg/dL (ref 8.9–10.3)
Chloride: 108 mmol/L (ref 98–111)
Creatinine: 1.02 mg/dL — ABNORMAL HIGH (ref 0.44–1.00)
GFR, Est AFR Am: >60 mL/min (ref >60)
GFR, Estimated: >60 mL/min (ref >60)
Glucose, Bld: 89 mg/dL (ref 70–99)
Potassium: 4.2 mmol/L (ref 3.5–5.1)
Sodium: 141 mmol/L (ref 135–145)
Total Bilirubin: <0.2 mg/dL — ABNORMAL LOW (ref 0.3–1.2)
Total Protein: 6.9 g/dL (ref 6.5–8.1)

## 2020-01-01 LAB — CBC WITH DIFFERENTIAL (CANCER CENTER ONLY)
Abs Immature Granulocytes: 0.01 10*3/uL (ref 0.00–0.07)
Basophils Absolute: 0 10*3/uL (ref 0.0–0.1)
Basophils Relative: 0 %
Eosinophils Absolute: 0.1 10*3/uL (ref 0.0–0.5)
Eosinophils Relative: 2 %
HCT: 34.3 % — ABNORMAL LOW (ref 36.0–46.0)
Hemoglobin: 11.5 g/dL — ABNORMAL LOW (ref 12.0–15.0)
Immature Granulocytes: 0 %
Lymphocytes Relative: 27 %
Lymphs Abs: 1.8 10*3/uL (ref 0.7–4.0)
MCH: 26.2 pg (ref 26.0–34.0)
MCHC: 33.5 g/dL (ref 30.0–36.0)
MCV: 78.1 fL — ABNORMAL LOW (ref 80.0–100.0)
Monocytes Absolute: 0.9 10*3/uL (ref 0.1–1.0)
Monocytes Relative: 13 %
Neutro Abs: 3.9 10*3/uL (ref 1.7–7.7)
Neutrophils Relative %: 58 %
Platelet Count: 142 10*3/uL — ABNORMAL LOW (ref 150–400)
RBC: 4.39 MIL/uL (ref 3.87–5.11)
RDW: 13.9 % (ref 11.5–15.5)
WBC Count: 6.7 10*3/uL (ref 4.0–10.5)
nRBC: 0 % (ref 0.0–0.2)

## 2020-01-04 ENCOUNTER — Encounter: Payer: Self-pay | Admitting: Hematology and Oncology

## 2020-01-04 NOTE — Progress Notes (Signed)
[facility] Telephone:(336) [phone]   Fax:([phone]  PROGRESS NOTE  Patient Care Team: Cheron Schaumann., MD as PCP - General (Internal Medicine)  Hematological/Oncological History #Thrombocytopenia in Pregnancy #Iron Deficiency in Pregnancy 1) 04/30/2009: WBC 10.5, Hgb 12.3, MCV 82, Plt 90 (noted to be large). Birth of patient's first daughter 2) 01/26/2013: Establish care with Dr. Cyndie Chime of the [facility]. WBC 8.0, Hgb 11.5, Plt 105, MCV 76.6. Measured during pregnancy with 2nd daughter. Thought to be benign thrombocytopenia of pregnancy.  3) 03/16/2013: return to Dr. Cyndie Chime with interval weekly labs. Plts 98-108 4) 10/27/2013: WBC 5.2, Plt 125, Hgb 12.1. Dr. Cyndie Chime notes she has a chronic mild thrombocytopenia now unrelated to pregnancy.Recommended yearly f/u. 5) 10/27/2013-06/26/2019: lost to follow up. 6) 06/26/2019: establish care with Dr. Leonides Schanz at [facility]. 7) 07/22/2019: WBC 8.4, Hgb 12.0, Plt 90, MCV 78.7. 8) 07/29/2019: Delivery of healthy baby boy  9) 10/02/2019: WBC 5.7, Hgb 11.1, Plt 136, MCV 78.0  Interval History:  Melissa Li 34 y.o. female with medical history significant for thrombocytopenia in pregnancy who presents for a follow up visit. The patient's last visit was on 10/02/2019 at which time the patients counts had not yet recovered from the procedure. In the interim since the last visit she has been well with no hospitalizations or ED visits.   On exam today Melissa Li notes that she has been well since our last visit in May.  She reports that she does still have some swelling in the legs and that these stay "puffed up" bilaterally since the pregnancy.  She notes that she is actively breast-feeding, though her menstrual cycles have returned.  She notes that she did have a tubal ligation after the delivery and therefore is not anticipating having any more children.  She reports that she is actively taking her iron pills without any issues  with constipation and that her energy level is quite good.  She denies any other overt signs of bleeding at this time.  A full 10 point ROS is listed below.  MEDICAL HISTORY:  Past Medical History:  Diagnosis Date  . PCOS (polycystic ovarian syndrome)   . Postpartum care following vaginal delivery (03/30/13) 03/31/2013  . Pregnancy, supervision of, high-risk 01/14/2013  . SVD (spontaneous vaginal delivery) 03/31/2013  . Thrombocytopenia complicating pregnancy ([facility]) 2010    SURGICAL HISTORY: Past Surgical History:  Procedure Laterality Date  . TUBAL LIGATION Bilateral 07/29/2019   Procedure: POST PARTUM TUBAL LIGATION;  Surgeon: Maxie Better, MD;  Location: [facility] LD ORS;  Service: Gynecology;  Laterality: Bilateral;    SOCIAL HISTORY: Social History   Socioeconomic History  . Marital status: Married    Spouse name: Not on file  . Number of children: Not on file  . Years of education: Not on file  . Highest education level: Not on file  Occupational History  . Not on file  Tobacco Use  . Smoking status: Never Smoker  . Smokeless tobacco: Never Used  Substance and Sexual Activity  . Alcohol use: Not Currently    Comment: 1 glass of wine per week prior to pregnancy  . Drug use: No  . Sexual activity: Yes    Birth control/protection: None  Other Topics Concern  . Not on file  Social History Narrative  . Not on file   Social Determinants of Health   Financial Resource Strain:   . Difficulty of Paying Living Expenses: Not on file  Food Insecurity:   . Worried About Running  Out of Food in the Last Year: Not on file  . Ran Out of Food in the Last Year: Not on file  Transportation Needs:   . Lack of Transportation (Medical): Not on file  . Lack of Transportation (Non-Medical): Not on file  Physical Activity:   . Days of Exercise per Week: Not on file  . Minutes of Exercise per Session: Not on file  Stress:   . Feeling of Stress : Not on file  Social Connections:     . Frequency of Communication with Friends and Family: Not on file  . Frequency of Social Gatherings with Friends and Family: Not on file  . Attends Religious Services: Not on file  . Active Member of Clubs or Organizations: Not on file  . Attends Banker Meetings: Not on file  . Marital Status: Not on file  Intimate Partner Violence:   . Fear of Current or Ex-Partner: Not on file  . Emotionally Abused: Not on file  . Physically Abused: Not on file  . Sexually Abused: Not on file    FAMILY HISTORY: Family History  Problem Relation Age of Onset  . Hypertension Mother   . Diabetes Mother   . Diabetes Paternal Aunt     ALLERGIES:  has No Known Allergies.  MEDICATIONS:  Current Outpatient Medications  Medication Sig Dispense Refill  . Acetaminophen 500 MG coapsule Take 500 mg by mouth as needed.    . ferrous sulfate 325 (65 FE) MG tablet Take 1 tablet (325 mg total) by mouth daily with breakfast. Please take with a source of Vitamin C 90 tablet 3  . Prenat-FeFmCb-DSS-FA-DHA w/o A (CITRANATAL HARMONY) 27-1-260 MG CAPS Take 260 mg by mouth daily.    . vitamin B-12 (CYANOCOBALAMIN) 1000 MCG tablet Take 1 tablet (1,000 mcg total) by mouth daily. 30 tablet 3   No current facility-administered medications for this visit.    REVIEW OF SYSTEMS:   Constitutional: ( - ) fevers, ( - )  chills , ( - ) night sweats Eyes: ( - ) blurriness of vision, ( - ) double vision, ( - ) watery eyes Ears, nose, mouth, throat, and face: ( - ) mucositis, ( - ) sore throat Respiratory: ( - ) cough, ( - ) dyspnea, ( - ) wheezes Cardiovascular: ( - ) palpitation, ( - ) chest discomfort, ( - ) lower extremity swelling Gastrointestinal:  ( - ) nausea, ( - ) heartburn, ( - ) change in bowel habits Skin: ( - ) abnormal skin rashes Lymphatics: ( - ) new lymphadenopathy, ( - ) easy bruising Neurological: ( - ) numbness, ( - ) tingling, ( - ) new weaknesses Behavioral/Psych: ( - ) mood change, ( - )  new changes  All other systems were reviewed with the patient and are negative.  PHYSICAL EXAMINATION: ECOG PERFORMANCE STATUS: 0 - Asymptomatic  Vitals:   01/01/20 1447 01/01/20 1450  BP: (!) 115/51 110/83  Pulse: 84   Resp: 19   Temp: 98.8 F (37.1 C)   SpO2: 100%    Filed Weights   01/01/20 1447  Weight: 245 lb 11.2 oz (111.4 kg)    GENERAL: well appearing young [place] female. alert, no distress and comfortable SKIN: skin color, texture, turgor are normal, no rashes or significant lesions EYES: conjunctiva are pink and non-injected, sclera clear LUNGS: clear to auscultation and percussion with normal breathing effort HEART: regular rate & rhythm and no murmurs and no lower extremity edema Musculoskeletal:  no cyanosis of digits and no clubbing  PSYCH: alert & oriented x 3, fluent speech NEURO: no focal motor/sensory deficits  LABORATORY DATA:  I have reviewed the data as listed CBC Latest Ref Rng & Units 01/01/2020 10/02/2019 07/31/2019  WBC 4.0 - 10.5 K/uL 6.7 5.7 9.3  Hemoglobin 12.0 - 15.0 g/dL 11.5(L) 11.1(L) 10.3(L)  Hematocrit 36 - 46 % 34.3(L) 33.6(L) 31.1(L)  Platelets 150 - 400 K/uL 142(L) 136(L) 81(L)    CMP Latest Ref Rng & Units 01/01/2020 10/02/2019 07/22/2019  Glucose 70 - 99 mg/dL 89 87 76  BUN 6 - 20 mg/dL 15 13 6   Creatinine 0.44 - 1.00 mg/dL ) 6.73(A 1.93  Sodium 135 - 145 mmol/L 141 141 135  Potassium 3.5 - 5.1 mmol/L 4.2 4.2 3.9  Chloride 98 - 111 mmol/L 108 108 106  CO2 22 - 32 mmol/L 28 24 24   Calcium 8.9 - 10.3 mg/dL 9.7 8.9 7.90)  Total Protein 6.5 - 8.1 g/dL 6.9 6.7 6.5  Total Bilirubin 0.3 - 1.2 mg/dL ) 2.4(O) 0.4  Alkaline Phos 38 - 126 U/L 73 62 88  AST 15 - 41 U/L 12(L) 18 15  ALT 0 - 44 U/L 11 22 13     No results found for: MPROTEIN No results found for: KPAFRELGTCHN, LAMBDASER, KAPLAMBRATIO   RADIOGRAPHIC STUDIES: I have personally reviewed the radiological images as listed and agreed with the findings in the  report. No results found.  ASSESSMENT & PLAN Melissa Li 34 y.o. female with medical history significant for thrombocytopenia in pregnancy who presents for a follow up visit.  After review the labs, discussion with the patient, and review of the records her findings are most consistent with a continued thrombocytopenia despite having delivered almost 6 months ago.  It is likely the patient has a component of ITP which is driving down her platelet count.  She has had a recovery from the time of delivery, though she has not returned to baseline.  She does have a baseline mild anemia as well as microcytosis, which may be related to her sickle cell trait previously noted by Dr. .  At this time given the stability of her counts and the relatively mild nature of her deficiencies I do not think she requires routine follow-up in our clinic.  I would recommend that she be referred to Lupe Carney in the event she were develop new or worsening cytopenias.  #Thrombocytopenia in Pregnancy #Iron Deficiency in Pregnancy --possible component of ITP as the Plt count has not normalized since the end of the pregnancy.  --patient does have sickle cell trait which may explain the mild anemia/microcytosis --iron stores are improved, though not entirely replete. Would recommend continuing to take PO iron 325 mg daily with a source of vitamin C.  --no need for routine f/u in our clinic unless she were to develop new or worsening cytopenias.   No orders of the defined types were placed in this encounter.   All questions were answered. The patient knows to call the clinic with any problems, questions or concerns.  A total of more than 30 minutes were spent on this encounter and over half of that time was spent on counseling and coordination of care as outlined above.   31, MD Department of Hematology/Oncology [facility] at [facility] Phone: [phone] Pager:  [phone] Email: [email or web address]  01/04/2020 7:30 PM

## 2020-03-01 ENCOUNTER — Telehealth: Payer: Self-pay | Admitting: *Deleted

## 2020-03-01 NOTE — Telephone Encounter (Signed)
Received call from patient requesting a medical exemption letter from Dr. Leonides Schanz as related to to the covid vaccine. She states the covid vaccine  is required where she works @ [employer].  Please advise

## 2020-03-07 NOTE — Telephone Encounter (Signed)
Received call from pt, again asking for medical exception letter in order not to take Covid vaccine. Advised that pt's thrombocytopenia was related to her pregnancy and as she is not pregnant, her platelets are returning to normal, there is no medical reason why she cannot receive the covid vaccine.  Pt voiced understanding.

## 2021-06-17 ENCOUNTER — Other Ambulatory Visit: Payer: Self-pay

## 2021-06-17 ENCOUNTER — Encounter (HOSPITAL_BASED_OUTPATIENT_CLINIC_OR_DEPARTMENT_OTHER): Payer: Self-pay | Admitting: Emergency Medicine

## 2021-06-17 ENCOUNTER — Emergency Department (HOSPITAL_BASED_OUTPATIENT_CLINIC_OR_DEPARTMENT_OTHER)
Admission: EM | Admit: 2021-06-17 | Discharge: 2021-06-17 | Disposition: A | Discharge status: Home / Self Care | Payer: BC Managed Care – PPO | Attending: Emergency Medicine | Admitting: Emergency Medicine

## 2021-06-17 DIAGNOSIS — R110 Nausea: Secondary | ICD-10-CM | POA: Insufficient documentation

## 2021-06-17 DIAGNOSIS — R42 Dizziness and giddiness: Secondary | ICD-10-CM | POA: Insufficient documentation

## 2021-06-17 LAB — CBC
HCT: 36.4 % (ref 36.0–46.0)
Hemoglobin: 11.9 g/dL — ABNORMAL LOW (ref 12.0–15.0)
MCH: 25.3 pg — ABNORMAL LOW (ref 26.0–34.0)
MCHC: 32.7 g/dL (ref 30.0–36.0)
MCV: 77.4 fL — ABNORMAL LOW (ref 80.0–100.0)
Platelets: 150 10*3/uL (ref 150–400)
RBC: 4.7 MIL/uL (ref 3.87–5.11)
RDW: 14.6 % (ref 11.5–15.5)
WBC: 7.9 10*3/uL (ref 4.0–10.5)
nRBC: 0 % (ref 0.0–0.2)

## 2021-06-17 MED ORDER — MECLIZINE HCL 25 MG PO TABS: 25.0000 mg | ORAL_TABLET | Freq: Three times a day (TID) | ORAL | 0 refills | Status: AC | PRN

## 2021-06-17 MED ORDER — MECLIZINE HCL 25 MG PO TABS
25.0000 mg | ORAL_TABLET | Freq: Once | ORAL | Status: AC
  Administered 2021-06-17: 25 mg via ORAL
  Filled 2021-06-17: qty 1

## 2021-06-17 NOTE — ED Notes (Signed)
Pt A&OX4 ambulatory at d/c with independent steady gait, NAD. Pt verbalized understanding of d/c information, prescription and follow up care.

## 2021-06-17 NOTE — ED Provider Notes (Addendum)
[facility] EMERGENCY DEPARTMENT Provider Note   CSN: [id number] Arrival date & time: 06/17/21  1745     History  Chief Complaint  Patient presents with   Dizziness    Melissa Li is a 36 y.o. female with a history of vertigo and thrombocytopenia presenting today for new onset dizziness a few hours ago.  Patient stated she was lying in bed and turned over when she felt a wave of dizziness that lasted a few minutes then relieved itself.  Dizziness worsened with movement and relieved by staying still.  Accompanied with nausea but no vomiting.  Had one brief episode of tinnitus yesterday that relieved itself without intervention.  Denies SOB, HA, weakness, changes in speech, confusion, recent illness, chest pain, or neck pain.  Describes one episode of numbness in her wrist when she woke up from sleep last night, which relieved itself within minutes.  Denies any current numbness.    Previous episode of vertigo treated with Antivert (2018).  Prescribed this medication outpatient and eventually stopped taking it.  Has a history of thrombocytopenia which has not been monitored in the last 6-12 months.    The history is provided by the patient, medical records and the spouse.  Dizziness Associated symptoms: nausea   Associated symptoms: no chest pain, no headaches, no shortness of breath and no vomiting       Home Medications Prior to Admission medications   Medication Sig Start Date End Date Taking? Authorizing Provider  meclizine (ANTIVERT) 25 MG tablet Take 1 tablet (25 mg total) by mouth 3 (three) times daily as needed for dizziness. 06/17/21  Yes Cecil Cobbs, PA-C  Acetaminophen 500 MG coapsule Take 500 mg by mouth as needed. 01/07/19   [provider]  ferrous sulfate 325 (65 FE) MG tablet Take 1 tablet (325 mg total) by mouth daily with breakfast. Please take with a source of Vitamin C 10/03/19   Jaci Standard, MD  Prenat-FeFmCb-DSS-FA-DHA w/o A  (CITRANATAL HARMONY) 27-1-260 MG CAPS Take 260 mg by mouth daily. 06/19/19   [provider]  vitamin B-12 (CYANOCOBALAMIN) 1000 MCG tablet Take 1 tablet (1,000 mcg total) by mouth daily. 07/02/19   Jaci Standard, MD      Allergies    Patient has no known allergies.    Review of Systems   Review of Systems  Constitutional:  Negative for chills and fatigue.  Respiratory:  Negative for shortness of breath.   Cardiovascular:  Negative for chest pain.  Gastrointestinal:  Positive for nausea. Negative for vomiting.  Musculoskeletal:  Negative for neck pain.  Neurological:  Positive for dizziness and numbness (Temporary of the right wrist). Negative for facial asymmetry, speech difficulty and headaches.  Psychiatric/Behavioral:  Negative for confusion.    Physical Exam Updated Vital Signs BP (!) 106/56    Pulse 75    Temp 98 F (36.7 C) (Oral)    Resp 18    Ht 5\' 3"  (1.6 m)    Wt 113.4 kg    LMP 05/29/2021    SpO2 100%    BMI 44.29 kg/m  Physical Exam Vitals and nursing note reviewed.  Constitutional:      General: She is not in acute distress.    Appearance: Normal appearance. She is well-developed. She is not ill-appearing or diaphoretic.  HENT:     Head: Normocephalic and atraumatic.     Mouth/Throat:     Mouth: Mucous membranes are moist.  Pharynx: Oropharynx is clear.  Eyes:     General: Lids are normal. Vision grossly intact. Gaze aligned appropriately. No visual field deficit or scleral icterus.    Extraocular Movements: Extraocular movements intact.     Conjunctiva/sclera: Conjunctivae normal.     Pupils: Pupils are equal, round, and reactive to light.     Visual Fields: Right eye visual fields normal and left eye visual fields normal.     Comments: Mild horizontal nystagmus.  Cardiovascular:     Rate and Rhythm: Normal rate and regular rhythm.     Pulses: Normal pulses.  Pulmonary:     Effort: Pulmonary effort is normal. No respiratory distress.   Musculoskeletal:        General: No swelling.     Cervical back: Neck supple. No tenderness.     Comments: Right wrist neurovascularly intact without diminished sensation  Skin:    General: Skin is warm and dry.     Capillary Refill: Capillary refill takes less than 2 seconds.  Neurological:     General: No focal deficit present.     Mental Status: She is alert and oriented to person, place, and time.     GCS: GCS eye subscore is 4. GCS verbal subscore is 5. GCS motor subscore is 6.     Cranial Nerves: No cranial nerve deficit, dysarthria or facial asymmetry.     Sensory: Sensation is intact.     Motor: Motor function is intact.  Psychiatric:        Mood and Affect: Mood normal.    ED Results / Procedures / Treatments   Labs (all labs ordered are listed, but only abnormal results are displayed) Labs Reviewed  CBC - Abnormal; Notable for the following components:      Result Value   Hemoglobin 11.9 (*)    MCV 77.4 (*)    MCH 25.3 (*)    All other components within normal limits    EKG None  Radiology No results found.  Procedures Procedures    Medications Ordered in ED Medications  meclizine (ANTIVERT) tablet 25 mg (25 mg Oral Given 06/17/21 1915)    ED Course/ Medical Decision Making/ A&P                           Medical Decision Making Amount and/or Complexity of Data Reviewed Labs: ordered.   Patient with mild horizontal nystagmus, which is consistent with previous records.  Negative skew test and normal neurologic exam.  No vertical or rotational nystagmus.  No evidence of ataxia on physical exam.  No slurred speech renal or weakness.  Doubt CVA or other central cause of vertigo.  I ordered and personally interpreted lab values to assess her platelets for thrombocytopenia.  PLTs are 150, which is normal.  I do not believe her presentation is due to low platelets.  History and physical consistent with peripheral vertigo symptoms.  Patient shows significant  improvement upon reevaluation.  Will discharge home with meclizine.  Patient instructed to followup with her primary care physician or neurology within 3 days for further evaluation. They are to return to the emergency department for new neurologic symptoms, loss of vision or other concerning symptoms.         Final Clinical Impression(s) / ED Diagnoses Final diagnoses:  Vertigo    Rx / DC Orders ED Discharge Orders          Ordered    meclizine (ANTIVERT) 25  MG tablet  3 times daily PRN        06/17/21 1939              Sandrea Hammond 06/17/21 1940    Gwyneth Sprout, MD 06/17/21 2158    Cecil Cobbs, PA-C 06/17/21 2316    Gwyneth Sprout, MD 06/18/21 [phone]

## 2021-06-17 NOTE — ED Triage Notes (Signed)
Pt reports dizziness and lightheadedness/nausea since about 1300; feels like room is spinning

## 2021-06-17 NOTE — Discharge Instructions (Addendum)
You have been provided for prescription of Antivert for your vertigo.  You may take this up to 3 times a day as needed for dizziness.  Follow-up with your primary care in the next 40 to 72 hours for reevaluation and medical management.

## 2024-03-28 ENCOUNTER — Other Ambulatory Visit: Payer: Self-pay

## 2024-03-28 ENCOUNTER — Emergency Department (HOSPITAL_BASED_OUTPATIENT_CLINIC_OR_DEPARTMENT_OTHER)
Admission: EM | Admit: 2024-03-28 | Discharge: 2024-03-28 | Disposition: A | Discharge status: Home / Self Care | Attending: Emergency Medicine | Admitting: Emergency Medicine

## 2024-03-28 ENCOUNTER — Emergency Department (HOSPITAL_BASED_OUTPATIENT_CLINIC_OR_DEPARTMENT_OTHER)

## 2024-03-28 ENCOUNTER — Encounter (HOSPITAL_BASED_OUTPATIENT_CLINIC_OR_DEPARTMENT_OTHER): Payer: Self-pay | Admitting: Emergency Medicine

## 2024-03-28 DIAGNOSIS — W010XXA Fall on same level from slipping, tripping and stumbling without subsequent striking against object, initial encounter: Secondary | ICD-10-CM | POA: Diagnosis not present

## 2024-03-28 DIAGNOSIS — M545 Low back pain, unspecified: Secondary | ICD-10-CM | POA: Insufficient documentation

## 2024-03-28 DIAGNOSIS — Y990 Civilian activity done for income or pay: Secondary | ICD-10-CM | POA: Insufficient documentation

## 2024-03-28 LAB — PREGNANCY, URINE: Preg Test, Ur: NEGATIVE

## 2024-03-28 MED ORDER — KETOROLAC TROMETHAMINE 10 MG PO TABS: 10.0000 mg | ORAL_TABLET | Freq: Four times a day (QID) | ORAL | 0 refills | Status: AC | PRN

## 2024-03-28 MED ORDER — KETOROLAC TROMETHAMINE 30 MG/ML IJ SOLN
30.0000 mg | Freq: Once | INTRAMUSCULAR | Status: AC
  Administered 2024-03-28: 30 mg via INTRAMUSCULAR
  Filled 2024-03-28: qty 1

## 2024-03-28 NOTE — Discharge Instructions (Signed)
 You are seen in the emergency department today for concerns of a fall.  Your x-ray was negative for any signs of fracture or other injury.  I suspect majority the pain that you are currently feeling is due to the impact of the fall.  I would recommend taking Tylenol  or ibuprofen  for pain as needed.  I have sent a prescription for Toradol  to your pharmacy which is a strong anti-inflammatory.  If you are taking the Toradol , do not take ibuprofen  or naproxen.  For any concerns of new or worsening symptoms, return to the emergency department.  Otherwise, please follow-up with your primary care provider.

## 2024-03-28 NOTE — ED Provider Notes (Signed)
 [place] EMERGENCY DEPARTMENT AT [facility] Provider Note   CSN: [id number] Arrival date & time: 03/28/24  1110     Patient presents with: Back Pain   Melissa Li is a 38 y.o. female.  Patient with past history significant for thrombocytopenia, PCOS presents to the emergency department concerns of back pain.  She reports she had a fall while she was at work and she landed on her right side.  She endorses some pain in her bilateral lower back.  No reported tingling, numbness, paresthesia.  She is taking some Tylenol  but states the pain is still fairly notable.   Back Pain      Prior to Admission medications   Medication Sig Start Date End Date Taking? Authorizing Provider  ketorolac  (TORADOL ) 10 MG tablet Take 1 tablet (10 mg total) by mouth every 6 (six) hours as needed. 03/28/24  Yes Ferd Horrigan A, PA-C  Acetaminophen  500 MG coapsule Take 500 mg by mouth as needed. 01/07/19   [provider]  ferrous sulfate  325 (65 FE) MG tablet Take 1 tablet (325 mg total) by mouth daily with breakfast. Please take with a source of Vitamin C 10/03/19   Dorsey, John T IV, MD  meclizine  (ANTIVERT ) 25 MG tablet Take 1 tablet (25 mg total) by mouth 3 (three) times daily as needed for dizziness. 06/17/21   Cockerham, Alicia M, PA-C  Prenat-FeFmCb-DSS-FA-DHA w/o A (CITRANATAL HARMONY) 27-1-260 MG CAPS Take 260 mg by mouth daily. 06/19/19   [provider]  vitamin B-12 (CYANOCOBALAMIN) 1000 MCG tablet Take 1 tablet (1,000 mcg total) by mouth daily. 07/02/19   Dorsey, John T IV, MD    Allergies: Patient has no known allergies.    Review of Systems  Musculoskeletal:  Positive for back pain.  All other systems reviewed and are negative.   Updated Vital Signs BP 120/73   Pulse 79   Temp 98.5 F (36.9 C)   Resp 16   Ht 5' 3 (1.6 Li)   Wt 123.4 kg   SpO2 100%   BMI 48.18 kg/Li   Physical Exam Vitals and nursing note reviewed.  Constitutional:      General: She  is not in acute distress.    Appearance: She is well-developed.  HENT:     Head: Normocephalic and atraumatic.  Eyes:     Conjunctiva/sclera: Conjunctivae normal.  Cardiovascular:     Rate and Rhythm: Normal rate and regular rhythm.     Heart sounds: No murmur heard. Pulmonary:     Effort: Pulmonary effort is normal. No respiratory distress.     Breath sounds: Normal breath sounds.  Abdominal:     Palpations: Abdomen is soft.     Tenderness: There is no abdominal tenderness.  Musculoskeletal:        General: No swelling.     Cervical back: Neck supple.  Skin:    General: Skin is warm and dry.     Capillary Refill: Capillary refill takes less than 2 seconds.  Neurological:     Mental Status: She is alert.  Psychiatric:        Mood and Affect: Mood normal.     (all labs ordered are listed, but only abnormal results are displayed) Labs Reviewed  PREGNANCY, URINE    EKG: None  Radiology: DG Lumbar Spine Complete Result Date: 03/28/2024 EXAM: 4 OR MORE VIEW(S) XRAY OF THE LUMBAR SPINE 03/28/2024 12:21:15 PM COMPARISON: None available. CLINICAL HISTORY: Fall FINDINGS: LUMBAR SPINE: BONES: No  acute fracture. No aggressive appearing osseous lesion. Alignment is normal. DISCS AND DEGENERATIVE CHANGES: No severe degenerative changes. SOFT TISSUES: No acute abnormality. IMPRESSION: 1. No acute abnormality of the lumbar spine. Electronically signed by: Waddell Calk MD 03/28/2024 12:27 PM EST RP Workstation: [id number]     Procedures   Medications Ordered in the ED  ketorolac  (TORADOL ) 30 MG/ML injection 30 mg (30 mg Intramuscular Given 03/28/24 1308)                                    Medical Decision Making Amount and/or Complexity of Data Reviewed Labs: ordered. Radiology: ordered.  Risk Prescription drug management.   This patient presents to the ED for concern of fall, back pain.  Differential diagnosis includes lumbar strain, lumbar fracture, pregnancy,  constipation    Additional history obtained:  Additional history obtained from chart review   Lab Tests:  I Ordered, and personally interpreted labs.  The pertinent results include: Urine pregnancy negative   Imaging Studies ordered:  I ordered imaging studies including x-ray lumbar spine I independently visualized and interpreted imaging which showed no acute finding seen on lumbar imaging I agree with the radiologist interpretation   Medicines ordered and prescription drug management:  I ordered medication including Toradol  for pain Reevaluation of the patient after these medicines showed that the patient improved I have reviewed the patients home medicines and have made adjustments as needed   Problem List / ED Course:  Patient presented to the emergency department with concerns of a fall and back pain.  Reports that she fell at work and landed on her right side.  Worsening pain to the right and left lower backs.  Denies urinary symptoms.  No reported tingling, numbness, paresthesia.  No history of chronic back pain. On exam, patient has tenderness to bilateral right and lower back's side with some radiation towards the hip.  No groin pain present.  Straight leg negative. Will proceed with imaging to rule out more concerning etiology but doubtful of significant injury given mechanism.  Urine pregnancy obtained for assessment and is negative.  Toradol  given for pain control. X-ray of the lumbar spine is negative.  Suspect likely lumbar strain given mechanism as well as likely contusion over.  Encourage patient take Tylenol  and ibuprofen  for pain as needed.  Return precautions discussed such as concerns for new or worsening symptoms.  Discharged home in stable condition.   Social Determinants of Health:  None  Final diagnoses:  Acute bilateral low back pain without sciatica    ED Discharge Orders          Ordered    ketorolac  (TORADOL ) 10 MG tablet  Every 6 hours PRN         03/28/24 1314               De Libman A, PA-C 03/28/24 1604    Melissa Li, Melissa M, DO 03/29/24 [phone]

## 2024-03-28 NOTE — ED Triage Notes (Signed)
 Pt c/o lower back pain after fall at work; slipped and fell on RT side
